# Patient Record
Sex: Male | Born: 1965
Health system: Southern US, Community
[De-identification: ages and names within clinical notes are randomized; demographics above are authoritative.]

## PROBLEM LIST (undated history)

## (undated) DIAGNOSIS — T7840XA Allergy, unspecified, initial encounter: Secondary | ICD-10-CM

## (undated) HISTORY — DX: Allergy, unspecified, initial encounter: T78.40XA

## (undated) HISTORY — PX: TONSILLECTOMY: SUR1361

## (undated) HISTORY — PX: KNEE SURGERY: SHX244

---

## 2007-07-31 ENCOUNTER — Encounter: Admission: RE | Admit: 2007-07-31 | Discharge: 2007-07-31 | Payer: Self-pay | Admitting: Internal Medicine

## 2010-10-20 ENCOUNTER — Telehealth: Payer: Self-pay | Admitting: Internal Medicine

## 2010-10-21 NOTE — Telephone Encounter (Signed)
I spoke with [facility] , patient is scheduled with Dr Leone Payor for tomorrow at 3:30.  They will notify the patient of the appointment date and time.

## 2010-10-21 NOTE — Telephone Encounter (Signed)
I have left a voicemail for the patient also about the appt date and time.

## 2010-10-22 ENCOUNTER — Ambulatory Visit (INDEPENDENT_AMBULATORY_CARE_PROVIDER_SITE_OTHER): Payer: BC Managed Care – PPO | Admitting: Internal Medicine

## 2010-10-22 ENCOUNTER — Encounter: Payer: Self-pay | Admitting: Internal Medicine

## 2010-10-22 VITALS — BP 124/60 | HR 88 | Ht 71.0 in | Wt 170.0 lb

## 2010-10-22 DIAGNOSIS — K921 Melena: Secondary | ICD-10-CM

## 2010-10-22 MED ORDER — PEG-KCL-NACL-NASULF-NA ASC-C 100 G PO SOLR: 1.0000 | Freq: Once | ORAL | Status: AC

## 2010-10-22 NOTE — Patient Instructions (Signed)
Colonoscopy has been scheduled for 10/26/10 2:30. Pm arrive at 1:30 pm on 4th floor. Instructions have been reviewed and copy given to you. Go to your pharmacy and pick up your prescription.

## 2010-10-22 NOTE — Progress Notes (Signed)
Subjective:    Patient ID: Cody Jennings, male    DOB: April 15, 1966, 45 y.o.   MRN: [id number]  HPI Comments: 45 year old white man with onset of passing blood about 3 months ago. Perhaps longer. He saw his primary care physician this resolved spontaneously. He went back and was evaluated and had a hemoglobin of 15.5 and hematocrit 44 with an MCV of 89 on 10/18/2010. He is describing painless passage of blood intermittently on at least a few occasions. He is an avid exerciser and was doing some exercises in the gym where he was inverted and flexing in his abdomen had some left lower quadrant and left flank pain at one point which has been helped by a chiropractor. He has not noted any bulging hemorrhoids and his primary care physician did not detect any on physical exam. In the interim he motion or test was negative for occult blood. He does not lift heavy weights. The blood was described as on the stool and in the commode but not necessarily on the toilet paper. Was in the stool as well. No melena described.  Rectal Bleeding  The current episode started more than 2 weeks ago. The onset was sudden. The problem occurs rarely. The patient is experiencing no pain. The stool is described as soft, bloody and mixed with blood. There was no prior successful therapy. There was no prior unsuccessful therapy. Associated symptoms include hematuria. Pertinent negatives include no anorexia, no fever, no abdominal pain, no diarrhea, no hemorrhoids, no nausea, no rectal pain and no vomiting.      Review of Systems  Constitutional: Negative for fever.  Gastrointestinal: Positive for hematochezia. Negative for nausea, vomiting, abdominal pain, diarrhea, rectal pain, anorexia and hemorrhoids.  Genitourinary: Positive for hematuria.   he had some microscopic hematuria and was prescribed Cipro for this empirically. All other abuse systems negative or as mentioned above in the history of present illness. History  reviewed. No pertinent past medical history. Past Surgical History  Procedure Date  . Knee surgery     Left  . Tonsillectomy     reports that he has never smoked. He has never used smokeless tobacco. He reports that he does not drink alcohol or use illicit drugs. family history includes Diabetes in his mother; Hyperlipidemia in an unspecified family member; Kidney cancer in his father; and Lung cancer in his father.  There is no history of Colon cancer. No Known Allergies Meds: Cipro 500 mg, Propecia, multivitamin, fish oil, vitamin D      Objective:   Physical Exam  Constitutional: He is oriented to person, place, and time. He appears well-developed and well-nourished.  HENT:  Head: Normocephalic.  Mouth/Throat: Oropharynx is clear and moist.  Eyes: Conjunctivae are normal. Pupils are equal, round, and reactive to light. No scleral icterus.  Neck: Normal range of motion. Neck supple. No thyromegaly present.  Cardiovascular: Normal rate, regular rhythm and normal heart sounds.   No murmur heard. Pulmonary/Chest: Effort normal and breath sounds normal.  Abdominal: Soft. Bowel sounds are normal. He exhibits no distension and no mass. There is no tenderness.  Genitourinary:       Rectal exam deferred until colonoscopy  Lymphadenopathy:    He has no cervical adenopathy.  Neurological: He is alert and oriented to person, place, and time.  Skin: Skin is warm and dry.  Psychiatric: He has a normal mood and affect.          Assessment & Plan:  Hematochezia, small volume  At  his age it is appropriate to investigate with a colonoscopy. We discussed flexible sigmoidoscopy versus colonoscopy. The odds are this is an anorectal source but the more serious lesion should be excluded. I've explained the risks benefits and indications and he understands and agrees to proceed.

## 2010-10-23 ENCOUNTER — Encounter: Payer: Self-pay | Admitting: Internal Medicine

## 2010-10-23 ENCOUNTER — Telehealth: Payer: Self-pay | Admitting: Internal Medicine

## 2010-10-26 ENCOUNTER — Other Ambulatory Visit: Payer: BC Managed Care – PPO | Admitting: Internal Medicine

## 2010-10-26 NOTE — Telephone Encounter (Signed)
Pt stated he cancelled his appt because his insurance states the procedure has to be done at an in house facility? When I checked with our Morrie Sheldon, she stated pt has a [employer] and his deductible is $5000. Informed the pt who stated he will check with [employer] and call back.

## 2010-10-28 NOTE — Telephone Encounter (Signed)
Pt has not called back.

## 2011-07-23 ENCOUNTER — Encounter (INDEPENDENT_AMBULATORY_CARE_PROVIDER_SITE_OTHER): Payer: BC Managed Care – PPO | Admitting: Family Medicine

## 2011-07-23 DIAGNOSIS — N529 Male erectile dysfunction, unspecified: Secondary | ICD-10-CM

## 2011-07-23 DIAGNOSIS — Z23 Encounter for immunization: Secondary | ICD-10-CM

## 2011-07-23 DIAGNOSIS — Z0000 Encounter for general adult medical examination without abnormal findings: Secondary | ICD-10-CM

## 2011-07-23 DIAGNOSIS — K921 Melena: Secondary | ICD-10-CM

## 2011-12-02 ENCOUNTER — Telehealth: Payer: Self-pay

## 2011-12-02 ENCOUNTER — Other Ambulatory Visit: Payer: Self-pay | Admitting: Family Medicine

## 2011-12-02 NOTE — Telephone Encounter (Signed)
Pt states he is on an ED rx (thinks viagra) and would like to change the dosage from 50mg  to 100mg  so he can 'cut them up'.  Please call pt to discuss and fill if possible.  Best: [phone] [facility]  Also states the pharmacy should be contacting us about another rx he is out of but can't remember the name of.

## 2011-12-03 ENCOUNTER — Other Ambulatory Visit: Payer: Self-pay | Admitting: Family Medicine

## 2011-12-03 MED ORDER — SILDENAFIL CITRATE 50 MG PO TABS: ORAL_TABLET | ORAL | Status: DC

## 2011-12-03 NOTE — Telephone Encounter (Signed)
Spoke with patient and let him know that we sent in refills for patients 2 medicines (Proscar and Viagra) and advised that he needed recheck for more.  Patient states that since the recheck was in regards to his cholesterol, he should be able to get his other meds refilled longer b/c they are not related to those labs.  Chart in MDs box to review and advise.

## 2011-12-27 ENCOUNTER — Other Ambulatory Visit: Payer: Self-pay | Admitting: Family Medicine

## 2011-12-27 MED ORDER — FINASTERIDE 5 MG PO TABS: ORAL_TABLET | ORAL | Status: DC

## 2011-12-27 MED ORDER — SILDENAFIL CITRATE 100 MG PO TABS: ORAL_TABLET | ORAL | Status: DC

## 2011-12-27 NOTE — Telephone Encounter (Signed)
I can refill the viagra and proscar through December, but will need to have ov with likely PSA testing at that time, as last checked in December 2012.

## 2011-12-27 NOTE — Telephone Encounter (Signed)
LMOM for pt that RFs were done through Dec but then OV/Labs due.

## 2012-04-21 ENCOUNTER — Encounter: Payer: BC Managed Care – PPO | Admitting: Family Medicine

## 2015-05-16 ENCOUNTER — Ambulatory Visit (INDEPENDENT_AMBULATORY_CARE_PROVIDER_SITE_OTHER): Payer: BLUE CROSS/BLUE SHIELD | Admitting: Family Medicine

## 2015-05-16 VITALS — BP 128/68 | HR 66 | Temp 97.9°F | Resp 16 | Ht 71.5 in | Wt 173.4 lb

## 2015-05-16 DIAGNOSIS — Z113 Encounter for screening for infections with a predominantly sexual mode of transmission: Secondary | ICD-10-CM

## 2015-05-16 DIAGNOSIS — Z114 Encounter for screening for human immunodeficiency virus [HIV]: Secondary | ICD-10-CM

## 2015-05-16 DIAGNOSIS — Z130 Encounter for screening for diseases of the blood and blood-forming organs and certain disorders involving the immune mechanism: Secondary | ICD-10-CM | POA: Diagnosis not present

## 2015-05-16 DIAGNOSIS — Z23 Encounter for immunization: Secondary | ICD-10-CM

## 2015-05-16 DIAGNOSIS — M25562 Pain in left knee: Secondary | ICD-10-CM

## 2015-05-16 DIAGNOSIS — Z0000 Encounter for general adult medical examination without abnormal findings: Secondary | ICD-10-CM

## 2015-05-16 DIAGNOSIS — Z131 Encounter for screening for diabetes mellitus: Secondary | ICD-10-CM

## 2015-05-16 DIAGNOSIS — Z13220 Encounter for screening for lipoid disorders: Secondary | ICD-10-CM | POA: Diagnosis not present

## 2015-05-16 LAB — COMPLETE METABOLIC PANEL WITH GFR
ALK PHOS: 54 U/L (ref 40–115)
ALT: 32 U/L (ref 9–46)
AST: 20 U/L (ref 10–40)
Albumin: 4.2 g/dL (ref 3.6–5.1)
BUN: 25 mg/dL (ref 7–25)
CO2: 25 mmol/L (ref 20–31)
Calcium: 9.3 mg/dL (ref 8.6–10.3)
Chloride: 102 mmol/L (ref 98–110)
Creat: 1.2 mg/dL (ref 0.60–1.35)
GFR, EST AFRICAN AMERICAN: 82 mL/min (ref >=60)
GFR, Est Non African American: 71 mL/min (ref >=60)
Glucose, Bld: 105 mg/dL — ABNORMAL HIGH (ref 65–99)
Potassium: 4.1 mmol/L (ref 3.5–5.3)
Sodium: 138 mmol/L (ref 135–146)
TOTAL PROTEIN: 6.7 g/dL (ref 6.1–8.1)
Total Bilirubin: 0.5 mg/dL (ref 0.2–1.2)

## 2015-05-16 LAB — POCT CBC
Granulocyte percent: 53.5 %G (ref 37–80)
HCT, POC: 42.6 % — AB (ref 43.5–53.7)
HEMOGLOBIN: 14.9 g/dL (ref 14.1–18.1)
Lymph, poc: 2.1 (ref 0.6–3.4)
MCH: 30.7 pg (ref 27–31.2)
MCHC: 35 g/dL (ref 31.8–35.4)
MCV: 87.8 fL (ref 80–97)
MID (CBC): 0.2 (ref 0–0.9)
MPV: 9.4 fL (ref 0–99.8)
PLATELET COUNT, POC: 188 10*3/uL (ref 142–424)
POC Granulocyte: 2.7 (ref 2–6.9)
POC LYMPH PERCENT: 42.1 %L (ref 10–50)
POC MID %: 4.4 %M (ref 0–12)
RBC: 4.86 M/uL (ref 4.69–6.13)
RDW, POC: 12.6 %
WBC: 5 10*3/uL (ref 4.6–10.2)

## 2015-05-16 LAB — LIPID PANEL
CHOL/HDL RATIO: 4.5 ratio (ref <=5.0)
CHOLESTEROL: 257 mg/dL — AB (ref 125–200)
HDL: 57 mg/dL (ref >=40)
LDL Cholesterol: 143 mg/dL — ABNORMAL HIGH (ref <130)
TRIGLYCERIDES: 283 mg/dL — AB (ref <150)
VLDL: 57 mg/dL — AB (ref <30)

## 2015-05-16 NOTE — Progress Notes (Addendum)
Subjective:    Patient ID: Cody Jennings, male    DOB: 09-Feb-1966, 49 y.o.   MRN: [id number] This chart was scribed for Meredith Staggers, MD by Littie Deeds, Medical Scribe. This patient was seen in Room 3 and the patient's care was started at 4:25 PM.    HPI HPI Comments: Cody Jennings is a 49 y.o. male who presents to the [facility] for a complete physical exam. Patient needs a CBC for a blepharoplasty that he will have next month in [place]. He is not fasting today; he ate about 2 hours ago.  Cancer screening: His father was a smoker has lung cancer. Prostate cancer screening - he has not had PSA testing.  Immunizations: He will have the flu vaccine today. He is unsure when his last tetanus shot was.  Depression screening:  Depression screen [facility] 2/9 05/16/2015  Decreased Interest 0  Down, Depressed, Hopeless 0  PHQ - 2 Score 0   Exercise: Patient notes he cannot run due to left knee pain.  Vision: Patient saw Dr. Hyacinth Meeker, OD about 1.5 years ago and obtained reading glasses.  Visual Acuity Screening   Right eye Left eye Both eyes  Without correction: 20/20 20/20 20/20   With correction:       Dentist: He does have a dentist. He uses dip tobacco, which his dentist is aware of.   Hair loss: He has used finasteride in the past for hair loss at 0.25 tablet of the 5 mg tablet and has also used the 1 mg finasteride. He is still on the finasteride which is written by Dr. Jacqualyn Posey in [facility].  Left knee pain: Patient has had some knee pain that started about a day after he had done hot yoga. He feels as if the pain is in the inside of his knee. He notes that he is unable to run. He denies weakness and swelling. Patient had a scoped excision of plica procedure for his left knee over 10 years ago.  STI testing: Patient has had new sexual partners (male), but he uses a condom with sexual intercourse each time. He denies history of STIs.   Patient moved to [place] 6-7  years ago and has been in the area for a few weeks; he will return to [place] tomorrow. He still has property here and has been trying to sell it. 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   There are no active problems to display for this patient.  Past Medical History  Diagnosis Date  . Allergy    Past Surgical History  Procedure Laterality Date  . Knee surgery      Left  . Tonsillectomy     No Known Allergies Prior to Admission medications   Medication Sig Start Date End Date Taking? Authorizing Provider  ciprofloxacin (CIPRO) 500 MG tablet Take 500 mg by mouth 2 (two) times daily.      Historical Provider, MD  finasteride (PROPECIA) 1 MG tablet Take 1 mg by mouth daily.      Historical Provider, MD  finasteride (PROSCAR) 5 MG tablet Take 1/4 tablet daily 12/27/11   Shade Flood, MD  Multiple Vitamin (MULTIVITAMIN) tablet Take 1 tablet by mouth daily.      Historical Provider, MD  Omega-3 Fatty Acids (FISH OIL CONCENTRATE PO) Take by mouth. Take as directed daily     Historical Provider, MD  sildenafil (VIAGRA) 100 MG tablet 1/2 to 1 pill as directed 12/27/11  Shade Flood, MD  VITAMIN D, CHOLECALCIFEROL, PO Take 1 tablet by mouth daily.      Historical Provider, MD   Social History   Social History  . Marital Status: Single    Spouse Name: N/A  . Number of Children: 0  . Years of Education: N/A   Occupational History  . Sales    Social History Main Topics  . Smoking status: Never Smoker   . Smokeless tobacco: Never Used  . Alcohol Use: No  . Drug Use: No  . Sexual Activity: Not on file   Other Topics Concern  . 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     Review of Systems  Musculoskeletal: Positive for arthralgias.       Objective:   Physical Exam  Constitutional: He is oriented to  person, place, and time. He appears well-developed and well-nourished. No distress.  HENT:  Head: Normocephalic and atraumatic.  Mouth/Throat: Oropharynx is clear and moist. No oropharyngeal exudate.  Eyes: Pupils are equal, round, and reactive to light.  Neck: Neck supple.  Cardiovascular: Normal rate.   Pulmonary/Chest: Effort normal.  Musculoskeletal: He exhibits no edema.  Left knee: Full ROM. Skin intact, no erythema. No effusion. No bony tenderness. Patella appears to track normally with only minimal J sign. Negative varus, negative valgus. Minimal discomfort with external rotation on McMurray testing. Negative Lachman.  Neurological: He is alert and oriented to person, place, and time. No cranial nerve deficit.  Skin: Skin is warm and dry. No rash noted.  Psychiatric: He has a normal mood and affect. His behavior is normal.  Nursing note and vitals reviewed.   Filed Vitals:   05/16/15 1552  BP: 128/68  Pulse: 66  Temp: 97.9 F (36.6 C)  TempSrc: Oral  Resp: 16  Height: 5' 11.5" (1.816 m)  Weight: 173 lb 6.4 oz (78.654 kg)  SpO2: 98%       Assessment & Plan:  Cody Jennings is a 49 y.o. male Annual physical exam  --anticipatory guidance as below in AVS, screening labs above. Health maintenance items as above in HPI discussed/recommended as applicable.   Screening for diabetes mellitus - Plan: COMPLETE METABOLIC PANEL WITH GFR  Screening for hyperlipidemia - Plan: Lipid panel  Screening, anemia, deficiency, iron - Plan: POCT CBC  Routine screening for STI (sexually transmitted infection) - Plan: GC/Chlamydia Probe Amp, HIV antibody, RPR  -safer sex practices.   Screening for HIV (human immunodeficiency virus) - Plan: HIV antibody  Left knee pain  - prior plica removal, likley degenerative change over the years. Reassuring exam. Trial of episodic NSAID, HEP and return for XR or refer to ortho if persists.   Plan on repeat CPE at 50yo in 1 year. Will discuss other  screening/cancer screening at that time.   No orders of the defined types were placed in this encounter.   Patient Instructions  advil or alleve if needed for knee pain.  Keep quads strengthened as these protect the knee. Try some of the exercises below. If worsening pain or not improved - return here or provider in [place] for XR.   You should receive a call or letter about your lab results within the next week to 10 days.  If cholesterol elevated - may need repeated 8 hours fasting.   Keeping you healthy  Get these tests  Blood pressure- Have your blood pressure checked once a year by your healthcare provider.  Normal blood pressure is 120/80.  Weight- Have your body mass index (BMI) calculated to screen for obesity.  BMI is a measure of body fat based on height and weight. You can also calculate your own BMI at https://www.west-esparza.com/.  Cholesterol- Have your cholesterol checked regularly starting at age 19, sooner may be necessary if you have diabetes, high blood pressure, if a family member developed heart diseases at an early age or if you smoke.   Chlamydia, HIV, and other sexual transmitted disease- Get screened each year until the age of 8 then within three months of each new sexual partner.  Diabetes- Have your blood sugar checked regularly if you have high blood pressure, high cholesterol, a family history of diabetes or if you are overweight.  Get these vaccines  Flu shot- Every fall.  Tetanus shot- Every 10 years.  Menactra- Single dose; prevents meningitis.  Take these steps  Don't smoke- If you do smoke, ask your healthcare provider about quitting. For tips on how to quit, go to www.smokefree.gov or call 1-800-QUIT-NOW.  Be physically active- Exercise 5 days a week for at least 30 minutes.  If you are not already physically active start slow and gradually work up to 30 minutes of moderate physical activity.  Examples of moderate activity include walking briskly,  mowing the yard, dancing, swimming bicycling, etc.  Eat a healthy diet- Eat a variety of healthy foods such as fruits, vegetables, low fat milk, low fat cheese, yogurt, lean meats, poultry, fish, beans, tofu, etc.  For more information on healthy eating, go to www.thenutritionsource.org  Drink alcohol in moderation- Limit alcohol intake two drinks or less a day.  Never drink and drive.  Dentist- Brush and floss teeth twice daily; visit your dentis twice a year.  Depression-Your emotional health is as important as your physical health.  If you're feeling down, losing interest in things you normally enjoy please talk with your healthcare provider.  Gun Safety- If you keep a gun in your home, keep it unloaded and with the safety lock on.  Bullets should be stored separately.  Helmet use- Always wear a helmet when riding a motorcycle, bicycle, rollerblading or skateboarding.  Safe sex- If you may be exposed to a sexually transmitted infection, use a condom  Seat belts- Seat bels can save your life; always wear one.  Smoke/Carbon Monoxide detectors- These detectors need to be installed on the appropriate level of your home.  Replace batteries at least once a year.  Skin Cancer- When out in the sun, cover up and use sunscreen SPF 15 or higher.  Violence- If anyone is threatening or hurting you, please tell your healthcare provider.  Generic Knee Exercises EXERCISES RANGE OF MOTION (ROM) AND STRETCHING EXERCISES These exercises may help you when beginning to rehabilitate your injury. Your symptoms may resolve with or without further involvement from your physician, physical therapist, or athletic trainer. While completing these exercises, remember:  6. Restoring tissue flexibility helps normal motion to return to the joints. This allows healthier, less painful movement and activity. 7. An effective stretch should be held for at least 30 seconds. 8. A stretch should never be painful. You should  only feel a gentle lengthening or release in the stretched tissue. STRETCH - Knee Extension, Prone 4. Lie on your stomach on a firm surface, such as a bed or countertop. Place your right / left  knee and leg just beyond the edge of the surface. You may wish to place a towel under the far end of your right / left thigh for comfort. 5. Relax your leg muscles and allow gravity to straighten your knee. Your clinician may advise you to add an ankle weight if more resistance is helpful for you. 6. You should feel a stretch in the back of your right / left knee. Hold this position for __________ seconds. Repeat __________ times. Complete this stretch __________ times per day. * Your physician, physical therapist, or athletic trainer may ask you to add ankle weight to enhance your stretch.  RANGE OF MOTION - Knee Flexion, Active 14. Lie on your back with both knees straight. (If this causes back discomfort, bend your opposite knee, placing your foot flat on the floor.) 15. Slowly slide your heel back toward your buttocks until you feel a gentle stretch in the front of your knee or thigh. 16. Hold for __________ seconds. Slowly slide your heel back to the starting position. Repeat __________ times. Complete this exercise __________ times per day.  STRETCH - Quadriceps, Prone   Lie on your stomach on a firm surface, such as a bed or padded floor.  Bend your right / left knee and grasp your ankle. If you are unable to reach your ankle or pant leg, use a belt around your foot to lengthen your reach.  Gently pull your heel toward your buttocks. Your knee should not slide out to the side. You should feel a stretch in the front of your thigh and/or knee.  Hold this position for __________ seconds. Repeat __________ times. Complete this stretch __________ times per day.  STRETCH - Hamstrings, Supine   Lie on your back. Loop a belt or towel over the ball of your right / left foot.  Straighten your right /  left knee and slowly pull on the belt to raise your leg. Do not allow the right / left knee to bend. Keep your opposite leg flat on the floor.  Raise the leg until you feel a gentle stretch behind your right / left knee or thigh. Hold this position for __________ seconds. Repeat __________ times. Complete this stretch __________ times per day.  STRENGTHENING EXERCISES These exercises may help you when beginning to rehabilitate your injury. They may resolve your symptoms with or without further involvement from your physician, physical therapist, or athletic trainer. While completing these exercises, remember:   Muscles can gain both the endurance and the strength needed for everyday activities through controlled exercises.  Complete these exercises as instructed by your physician, physical therapist, or athletic trainer. Progress the resistance and repetitions only as guided.  You may experience muscle soreness or fatigue, but the pain or discomfort you are trying to eliminate should never worsen during these exercises. If this pain does worsen, stop and make certain you are following the directions exactly. If the pain is still present after adjustments, discontinue the exercise until you can discuss the trouble with your clinician. STRENGTH - Quadriceps, Isometrics  Lie on your back with your right / left leg extended and your opposite knee bent.  Gradually tense the muscles in the front of your right / left thigh. You should see either your knee cap slide up toward your hip or increased dimpling just above the knee. This motion will push the back of the knee down toward the floor/mat/bed on which you are lying.  Hold the muscle as tight as you  can without increasing your pain for __________ seconds.  Relax the muscles slowly and completely in between each repetition. Repeat __________ times. Complete this exercise __________ times per day.  STRENGTH - Quadriceps, Short Arcs   Lie on your  back. Place a __________ inch towel roll under your knee so that the knee slightly bends.  Raise only your lower leg by tightening the muscles in the front of your thigh. Do not allow your thigh to rise.  Hold this position for __________ seconds. Repeat __________ times. Complete this exercise __________ times per day.  OPTIONAL ANKLE WEIGHTS: Begin with ____________________, but DO NOT exceed ____________________. Increase in 1 pound/0.5 kilogram increments.  STRENGTH - Quadriceps, Straight Leg Raises  Quality counts! Watch for signs that the quadriceps muscle is working to insure you are strengthening the correct muscles and not "cheating" by substituting with healthier muscles.  Lay on your back with your right / left leg extended and your opposite knee bent.  Tense the muscles in the front of your right / left thigh. You should see either your knee cap slide up or increased dimpling just above the knee. Your thigh may even quiver.  Tighten these muscles even more and raise your leg 4 to 6 inches off the floor. Hold for __________ seconds.  Keeping these muscles tense, lower your leg.  Relax the muscles slowly and completely in between each repetition. Repeat __________ times. Complete this exercise __________ times per day.  STRENGTH - Hamstring, Curls  Lay on your stomach with your legs extended. (If you lay on a bed, your feet may hang over the edge.)  Tighten the muscles in the back of your thigh to bend your right / left knee up to 90 degrees. Keep your hips flat on the bed/floor.  Hold this position for __________ seconds.  Slowly lower your leg back to the starting position. Repeat __________ times. Complete this exercise __________ times per day.  OPTIONAL ANKLE WEIGHTS: Begin with ____________________, but DO NOT exceed ____________________. Increase in 1 pound/0.5 kilogram increments.  STRENGTH - Quadriceps, Squats  Stand in a door frame so that your feet and knees are  in line with the frame.  Use your hands for balance, not support, on the frame.  Slowly lower your weight, bending at the hips and knees. Keep your lower legs upright so that they are parallel with the door frame. Squat only within the range that does not increase your knee pain. Never let your hips drop below your knees.  Slowly return upright, pushing with your legs, not pulling with your hands. Repeat __________ times. Complete this exercise __________ times per day.  STRENGTH - Quadriceps, Wall Slides  Follow guidelines for form closely. Increased knee pain often results from poorly placed feet or knees.  Lean against a smooth wall or door and walk your feet out 18-24 inches. Place your feet hip-width apart.  Slowly slide down the wall or door until your knees bend __________ degrees.* Keep your knees over your heels, not your toes, and in line with your hips, not falling to either side.  Hold for __________ seconds. Stand up to rest for __________ seconds in between each repetition. Repeat __________ times. Complete this exercise __________ times per day. * Your physician, physical therapist, or athletic trainer will alter this angle based on your symptoms and progress.   This information is not intended to replace advice given to you by your health care provider. Make sure you discuss any questions you  have with your health care provider.   Document Released: 05/26/2005 Document Revised: 08/02/2014 Document Reviewed: 10/24/2008 Elsevier Interactive Patient Education [employer]     I personally performed the services described in this documentation, which was scribed in my presence. The recorded information has been reviewed and considered, and addended by me as needed.    By signing my name below, I, Littie Deeds, attest that this documentation has been prepared under the direction and in the presence of Meredith Staggers, MD.  Electronically Signed: Littie Deeds, Medical  Scribe. 05/16/2015. 4:25 PM.

## 2015-05-16 NOTE — Patient Instructions (Signed)
advil or alleve if needed for knee pain.  Keep quads strengthened as these protect the knee. Try some of the exercises below. If worsening pain or not improved - return here or provider in [place] for XR.   You should receive a call or letter about your lab results within the next week to 10 days.  If cholesterol elevated - may need repeated 8 hours fasting.   Keeping you healthy  Get these tests  Blood pressure- Have your blood pressure checked once a year by your healthcare provider.  Normal blood pressure is 120/80.  Weight- Have your body mass index (BMI) calculated to screen for obesity.  BMI is a measure of body fat based on height and weight. You can also calculate your own BMI at https://www.west-esparza.com/.  Cholesterol- Have your cholesterol checked regularly starting at age 59, sooner may be necessary if you have diabetes, high blood pressure, if a family member developed heart diseases at an early age or if you smoke.   Chlamydia, HIV, and other sexual transmitted disease- Get screened each year until the age of 22 then within three months of each new sexual partner.  Diabetes- Have your blood sugar checked regularly if you have high blood pressure, high cholesterol, a family history of diabetes or if you are overweight.  Get these vaccines  Flu shot- Every fall.  Tetanus shot- Every 10 years.  Menactra- Single dose; prevents meningitis.  Take these steps  Don't smoke- If you do smoke, ask your healthcare provider about quitting. For tips on how to quit, go to www.smokefree.gov or call 1-800-QUIT-NOW.  Be physically active- Exercise 5 days a week for at least 30 minutes.  If you are not already physically active start slow and gradually work up to 30 minutes of moderate physical activity.  Examples of moderate activity include walking briskly, mowing the yard, dancing, swimming bicycling, etc.  Eat a healthy diet- Eat a variety of healthy foods such as fruits, vegetables, low  fat milk, low fat cheese, yogurt, lean meats, poultry, fish, beans, tofu, etc.  For more information on healthy eating, go to www.thenutritionsource.org  Drink alcohol in moderation- Limit alcohol intake two drinks or less a day.  Never drink and drive.  Dentist- Brush and floss teeth twice daily; visit your dentis twice a year.  Depression-Your emotional health is as important as your physical health.  If you're feeling down, losing interest in things you normally enjoy please talk with your healthcare provider.  Gun Safety- If you keep a gun in your home, keep it unloaded and with the safety lock on.  Bullets should be stored separately.  Helmet use- Always wear a helmet when riding a motorcycle, bicycle, rollerblading or skateboarding.  Safe sex- If you may be exposed to a sexually transmitted infection, use a condom  Seat belts- Seat bels can save your life; always wear one.  Smoke/Carbon Monoxide detectors- These detectors need to be installed on the appropriate level of your home.  Replace batteries at least once a year.  Skin Cancer- When out in the sun, cover up and use sunscreen SPF 15 or higher.  Violence- If anyone is threatening or hurting you, please tell your healthcare provider.  Generic Knee Exercises EXERCISES RANGE OF MOTION (ROM) AND STRETCHING EXERCISES These exercises may help you when beginning to rehabilitate your injury. Your symptoms may resolve with or without further involvement from your physician, physical therapist, or athletic trainer. While completing these exercises, remember:  6. Restoring tissue  flexibility helps normal motion to return to the joints. This allows healthier, less painful movement and activity. 7. An effective stretch should be held for at least 30 seconds. 8. A stretch should never be painful. You should only feel a gentle lengthening or release in the stretched tissue. STRETCH - Knee Extension, Prone 4. Lie on your stomach on a firm  surface, such as a bed or countertop. Place your right / left knee and leg just beyond the edge of the surface. You may wish to place a towel under the far end of your right / left thigh for comfort. 5. Relax your leg muscles and allow gravity to straighten your knee. Your clinician may advise you to add an ankle weight if more resistance is helpful for you. 6. You should feel a stretch in the back of your right / left knee. Hold this position for __________ seconds. Repeat __________ times. Complete this stretch __________ times per day. * Your physician, physical therapist, or athletic trainer may ask you to add ankle weight to enhance your stretch.  RANGE OF MOTION - Knee Flexion, Active 14. Lie on your back with both knees straight. (If this causes back discomfort, bend your opposite knee, placing your foot flat on the floor.) 15. Slowly slide your heel back toward your buttocks until you feel a gentle stretch in the front of your knee or thigh. 16. Hold for __________ seconds. Slowly slide your heel back to the starting position. Repeat __________ times. Complete this exercise __________ times per day.  STRETCH - Quadriceps, Prone   Lie on your stomach on a firm surface, such as a bed or padded floor.  Bend your right / left knee and grasp your ankle. If you are unable to reach your ankle or pant leg, use a belt around your foot to lengthen your reach.  Gently pull your heel toward your buttocks. Your knee should not slide out to the side. You should feel a stretch in the front of your thigh and/or knee.  Hold this position for __________ seconds. Repeat __________ times. Complete this stretch __________ times per day.  STRETCH - Hamstrings, Supine   Lie on your back. Loop a belt or towel over the ball of your right / left foot.  Straighten your right / left knee and slowly pull on the belt to raise your leg. Do not allow the right / left knee to bend. Keep your opposite leg flat on the  floor.  Raise the leg until you feel a gentle stretch behind your right / left knee or thigh. Hold this position for __________ seconds. Repeat __________ times. Complete this stretch __________ times per day.  STRENGTHENING EXERCISES These exercises may help you when beginning to rehabilitate your injury. They may resolve your symptoms with or without further involvement from your physician, physical therapist, or athletic trainer. While completing these exercises, remember:   Muscles can gain both the endurance and the strength needed for everyday activities through controlled exercises.  Complete these exercises as instructed by your physician, physical therapist, or athletic trainer. Progress the resistance and repetitions only as guided.  You may experience muscle soreness or fatigue, but the pain or discomfort you are trying to eliminate should never worsen during these exercises. If this pain does worsen, stop and make certain you are following the directions exactly. If the pain is still present after adjustments, discontinue the exercise until you can discuss the trouble with your clinician. STRENGTH - Quadriceps, Isometrics  Lie on your back with your right / left leg extended and your opposite knee bent.  Gradually tense the muscles in the front of your right / left thigh. You should see either your knee cap slide up toward your hip or increased dimpling just above the knee. This motion will push the back of the knee down toward the floor/mat/bed on which you are lying.  Hold the muscle as tight as you can without increasing your pain for __________ seconds.  Relax the muscles slowly and completely in between each repetition. Repeat __________ times. Complete this exercise __________ times per day.  STRENGTH - Quadriceps, Short Arcs   Lie on your back. Place a __________ inch towel roll under your knee so that the knee slightly bends.  Raise only your lower leg by tightening the  muscles in the front of your thigh. Do not allow your thigh to rise.  Hold this position for __________ seconds. Repeat __________ times. Complete this exercise __________ times per day.  OPTIONAL ANKLE WEIGHTS: Begin with ____________________, but DO NOT exceed ____________________. Increase in 1 pound/0.5 kilogram increments.  STRENGTH - Quadriceps, Straight Leg Raises  Quality counts! Watch for signs that the quadriceps muscle is working to insure you are strengthening the correct muscles and not "cheating" by substituting with healthier muscles.  Lay on your back with your right / left leg extended and your opposite knee bent.  Tense the muscles in the front of your right / left thigh. You should see either your knee cap slide up or increased dimpling just above the knee. Your thigh may even quiver.  Tighten these muscles even more and raise your leg 4 to 6 inches off the floor. Hold for __________ seconds.  Keeping these muscles tense, lower your leg.  Relax the muscles slowly and completely in between each repetition. Repeat __________ times. Complete this exercise __________ times per day.  STRENGTH - Hamstring, Curls  Lay on your stomach with your legs extended. (If you lay on a bed, your feet may hang over the edge.)  Tighten the muscles in the back of your thigh to bend your right / left knee up to 90 degrees. Keep your hips flat on the bed/floor.  Hold this position for __________ seconds.  Slowly lower your leg back to the starting position. Repeat __________ times. Complete this exercise __________ times per day.  OPTIONAL ANKLE WEIGHTS: Begin with ____________________, but DO NOT exceed ____________________. Increase in 1 pound/0.5 kilogram increments.  STRENGTH - Quadriceps, Squats  Stand in a door frame so that your feet and knees are in line with the frame.  Use your hands for balance, not support, on the frame.  Slowly lower your weight, bending at the hips and  knees. Keep your lower legs upright so that they are parallel with the door frame. Squat only within the range that does not increase your knee pain. Never let your hips drop below your knees.  Slowly return upright, pushing with your legs, not pulling with your hands. Repeat __________ times. Complete this exercise __________ times per day.  STRENGTH - Quadriceps, Wall Slides  Follow guidelines for form closely. Increased knee pain often results from poorly placed feet or knees.  Lean against a smooth wall or door and walk your feet out 18-24 inches. Place your feet hip-width apart.  Slowly slide down the wall or door until your knees bend __________ degrees.* Keep your knees over your heels, not your toes, and in line with your  hips, not falling to either side.  Hold for __________ seconds. Stand up to rest for __________ seconds in between each repetition. Repeat __________ times. Complete this exercise __________ times per day. * Your physician, physical therapist, or athletic trainer will alter this angle based on your symptoms and progress.   This information is not intended to replace advice given to you by your health care provider. Make sure you discuss any questions you have with your health care provider.   Document Released: 05/26/2005 Document Revised: 08/02/2014 Document Reviewed: 10/24/2008 Elsevier Interactive Patient Education [employer]

## 2015-05-17 LAB — GC/CHLAMYDIA PROBE AMP
CT Probe RNA: NEGATIVE
GC Probe RNA: NEGATIVE

## 2015-05-17 LAB — HIV ANTIBODY (ROUTINE TESTING W REFLEX): HIV 1&2 Ab, 4th Generation: NONREACTIVE

## 2015-05-17 LAB — RPR

## 2015-05-28 ENCOUNTER — Encounter: Payer: Self-pay | Admitting: *Deleted

## 2015-06-28 ENCOUNTER — Ambulatory Visit (INDEPENDENT_AMBULATORY_CARE_PROVIDER_SITE_OTHER): Payer: BLUE CROSS/BLUE SHIELD | Admitting: Physician Assistant

## 2015-06-28 VITALS — BP 102/60 | HR 58 | Temp 97.5°F | Resp 16 | Ht 71.75 in | Wt 174.4 lb

## 2015-06-28 DIAGNOSIS — N529 Male erectile dysfunction, unspecified: Secondary | ICD-10-CM

## 2015-06-28 DIAGNOSIS — R109 Unspecified abdominal pain: Secondary | ICD-10-CM

## 2015-06-28 MED ORDER — TADALAFIL 10 MG PO TABS: 10.0000 mg | ORAL_TABLET | Freq: Every day | ORAL | Status: DC | PRN

## 2015-06-28 NOTE — Progress Notes (Signed)
Cody Jennings  MRN: [id number] DOB: [birth year]  Subjective:  Pt presents to clinic with mid abd pain more like a discomfort for the last 6 months - seems to be most related to extension of the abd wall muscles like when he is doing dead lifts.  He does not feel like it is related to anything else and he has not found anything that makes it worse.  He states the pain is dull and has been in the same location since it started and it has not gotten more frequent.  It happens about 3 times a week and lasts less than and hour when it occurs and then spontaneously resolves.  He does not have to stop/change his activities when it occurs and the pain has not changed anything that he does as a result of this pain.  He has had no change in his bowel habits.  He has rare blood on toilet tissue - about 2-3 times this year.  At the end of the visit he asks for a Rx for Cialis - he has been on it before for inability to maintain an erection during sexual events.  He still gets morning erections but they are softer than they used to be.  He has noted that over the last several years he has noticed a decrease in his energy - he believes he has had a PSA done in the past but he does not believe he has ever had a testosterone test done.  There are no active problems to display for this patient.   Current Outpatient Prescriptions on File Prior to Visit  Medication Sig Dispense Refill  . finasteride (PROPECIA) 1 MG tablet Take 1 mg by mouth daily.       No current facility-administered medications on file prior to visit.    No Known Allergies  Review of Systems  Gastrointestinal: Positive for abdominal pain. Negative for diarrhea, constipation and blood in stool.   Objective:  BP 102/60 mmHg  Pulse 58  Temp(Src) 97.5 F (36.4 C) (Oral)  Resp 16  Ht 5' 11.75" (1.822 m)  Wt 174 lb 6.4 oz (79.107 kg)  BMI 23.83 kg/m2  SpO2 97%  Physical Exam  Constitutional: He is oriented to person, place, and time  and well-developed, well-nourished, and in no distress.  HENT:  Head: Normocephalic and atraumatic.  Right Ear: External ear normal.  Left Ear: External ear normal.  Eyes: Conjunctivae are normal.  Neck: Normal range of motion.  Cardiovascular: Normal rate, regular rhythm and normal heart sounds.   No murmur heard. Pulmonary/Chest: Effort normal and breath sounds normal. He has no wheezes.  Abdominal: Soft. Normal appearance and bowel sounds are normal. He exhibits no distension and no mass. There is no hepatosplenomegaly. There is no tenderness. There is no rebound and no guarding.    Neurological: He is alert and oriented to person, place, and time. Gait normal.  Skin: Skin is warm and dry.  Psychiatric: Mood, memory, affect and judgment normal.    Assessment and Plan :  Abdominal wall pain - Plan: Ambulatory referral to General Surgery  Impotence - Plan: Testosterone, Free, Total, SHBG, tadalafil (CIALIS) 10 MG tablet   I am unable to feel a hernia but that is what the patient is describing - he would like to have a surgeon evaluate this discomfort.  A referral was done but he would like to make his own appointment.  I gave him a Rx for cialis since he has had in  the past with good successful - due to his age and no co-morbidities we will check a testosterone level today.  Cody Lennert PA-C  [facility] 06/28/2015 5:17 PM

## 2015-06-28 NOTE — Patient Instructions (Signed)
[facility] [address] [phone]

## 2015-06-30 LAB — TESTOSTERONE, FREE, TOTAL, SHBG
Sex Hormone Binding: 41 nmol/L (ref 10–50)
TESTOSTERONE FREE: 50 pg/mL (ref 47.0–244.0)
TESTOSTERONE-% FREE: 1.7 % (ref 1.6–2.9)
Testosterone: 292 ng/dL — ABNORMAL LOW (ref 300–890)

## 2015-07-09 ENCOUNTER — Telehealth: Payer: Self-pay

## 2015-07-09 DIAGNOSIS — R7989 Other specified abnormal findings of blood chemistry: Secondary | ICD-10-CM

## 2015-07-09 NOTE — Telephone Encounter (Signed)
Pt would like referral for low testosterone

## 2015-08-07 ENCOUNTER — Encounter: Payer: Self-pay | Admitting: Endocrinology

## 2015-08-25 ENCOUNTER — Telehealth: Payer: Self-pay

## 2015-08-25 DIAGNOSIS — R109 Unspecified abdominal pain: Secondary | ICD-10-CM

## 2015-08-25 DIAGNOSIS — M25562 Pain in left knee: Secondary | ICD-10-CM

## 2015-08-25 NOTE — Telephone Encounter (Signed)
Possible hernia - second call - last week only in town a couple of weeks  Left knee - ortho - Dr. Neva Seat   Hernia - Benny Lennert   Requesting two referrals  [id number]

## 2015-08-25 NOTE — Telephone Encounter (Signed)
Referrals made from last ov notes.  Advised pt.

## 2015-08-26 ENCOUNTER — Telehealth: Payer: Self-pay

## 2015-08-26 DIAGNOSIS — N529 Male erectile dysfunction, unspecified: Secondary | ICD-10-CM

## 2015-08-26 NOTE — Telephone Encounter (Signed)
Cody Jennings, pharm reqs a new Rx for cialis, but they have req'd strength/dose different from what you Rxd. I pended what you gave the pt. The pharm reqs tadalafil 20 mg, 1 po prn max 1 tab Q36 hrs. Please advise. The pharm is not coming up in [facility]: [name], [place], [place], Ph # [phone], fax # [phone]. Rf will need to be faxed in.

## 2015-08-27 MED ORDER — TADALAFIL 20 MG PO TABS
10.0000 mg | ORAL_TABLET | ORAL | Status: DC | PRN
Start: 2015-08-27 — End: 2015-12-11

## 2015-08-27 NOTE — Telephone Encounter (Signed)
Faxed

## 2015-08-27 NOTE — Telephone Encounter (Signed)
Done

## 2015-09-02 ENCOUNTER — Telehealth: Payer: Self-pay

## 2015-09-02 DIAGNOSIS — R109 Unspecified abdominal pain: Secondary | ICD-10-CM

## 2015-09-02 NOTE — Telephone Encounter (Signed)
The patient called to ask for an imaging order to evaluate if he has a hernia.  He said he thinks there was a misunderstanding about what he wants.  I asked him if he still wanted the referral for general surgery, but he said he just wants the "right" referral (the imaging scan).  Please advise, thank you.  CB#: [phone]

## 2015-09-02 NOTE — Telephone Encounter (Signed)
The patient left a message about his referral to general surgery for abdominal pain.  He said he's called 4-5 times regarding this referral.  The referral was sent to [facility] on 08/26/15 and we received a call stating that they do not see for abdominal pain.  A message was sent to Benny Lennert on 08/28/15 requesting for the referral diagnosis to be changed.  Please advise, thank you.  Pt CB#: [phone]

## 2015-09-02 NOTE — Telephone Encounter (Signed)
The referral is because the patient thinks he has an abd wall hernia.  He wanted to see a Careers adviser when I saw him.  If he wants an [place] at this point please let me know.  I am happy to do either.

## 2015-09-02 NOTE — Telephone Encounter (Signed)
Sarah  Please see previous message 

## 2015-09-02 NOTE — Telephone Encounter (Signed)
Advised patient is a different phone message.

## 2015-09-03 NOTE — Telephone Encounter (Signed)
Spoke to pt. He states he did not want referral to surgeon. He is fine with [place]. Pt leaves next Saturday to go back home to [place] so he needs to have US done ASAP. He does not reside in [place] normally.

## 2015-09-04 NOTE — Telephone Encounter (Signed)
I have placed the order for the patient and he should be hearing soon about the appt.

## 2015-09-09 NOTE — Telephone Encounter (Signed)
Spoke with pt. He has been scheduled for u/s.

## 2015-09-10 ENCOUNTER — Ambulatory Visit: Payer: Self-pay | Admitting: Endocrinology

## 2015-09-11 ENCOUNTER — Inpatient Hospital Stay: Admission: RE | Admit: 2015-09-11 | Payer: Self-pay | Source: Ambulatory Visit

## 2015-09-11 ENCOUNTER — Ambulatory Visit
Admission: RE | Admit: 2015-09-11 | Discharge: 2015-09-11 | Disposition: A | Discharge status: Home / Self Care | Payer: BLUE CROSS/BLUE SHIELD | Source: Ambulatory Visit | Attending: Physician Assistant | Admitting: Physician Assistant

## 2015-09-11 ENCOUNTER — Other Ambulatory Visit: Payer: Self-pay | Admitting: Physician Assistant

## 2015-09-11 DIAGNOSIS — R109 Unspecified abdominal pain: Secondary | ICD-10-CM

## 2015-09-19 ENCOUNTER — Other Ambulatory Visit: Payer: Self-pay | Admitting: Physician Assistant

## 2015-09-19 DIAGNOSIS — K439 Ventral hernia without obstruction or gangrene: Secondary | ICD-10-CM | POA: Insufficient documentation

## 2015-12-11 ENCOUNTER — Other Ambulatory Visit: Payer: Self-pay

## 2015-12-11 NOTE — Telephone Encounter (Signed)
Patient is calling to request a refill Tadalafil. He states they have been trying to send us a fax. [facility] in [place] Fax: [phone]

## 2015-12-11 NOTE — Telephone Encounter (Signed)
I actually got a RF request from another pharm in [place], but suppose we should send to the one below that pt has req'd. Maralyn SagoSarah,

## 2015-12-13 NOTE — Telephone Encounter (Signed)
I gave him #90 in Feb - is he out or is he changing pharmacy

## 2015-12-18 MED ORDER — TADALAFIL 20 MG PO TABS: 10.0000 mg | ORAL_TABLET | ORAL | Status: DC | PRN

## 2015-12-18 NOTE — Telephone Encounter (Signed)
Meds ordered this encounter  Medications  . tadalafil (CIALIS) 20 MG tablet    Sig: Take 0.5-1 tablets (10-20 mg total) by mouth every other day as needed for erectile dysfunction.    Dispense:  90 tablet    Refill:  0

## 2015-12-18 NOTE — Telephone Encounter (Signed)
Three months? The Rx is due now. Pt very frustrated about this refill not being sent in. Can we refill?

## 2015-12-18 NOTE — Telephone Encounter (Signed)
Please print and sign and place in nurses box. I have to fax it to [place].

## 2015-12-19 NOTE — Telephone Encounter (Signed)
Faxed to [facility] in [place] as req'd below by pt. Notified pt and confirmed fax # online.

## 2016-11-08 ENCOUNTER — Other Ambulatory Visit: Payer: Self-pay | Admitting: Family Medicine

## 2016-11-08 NOTE — Telephone Encounter (Signed)
This is for Cody Jennings or [name] the pt wanted me to let both of them know that he need a refill on Cialis he hasn't been here since 2016 he is out of town for a few weeks I told him that I could send message to only one provider he was very persistent for both providers to get the message please respond

## 2016-11-09 MED ORDER — TADALAFIL 20 MG PO TABS: 10.0000 mg | ORAL_TABLET | ORAL | 0 refills | Status: DC | PRN

## 2016-11-09 NOTE — Telephone Encounter (Signed)
I have given him a 10d supply - I think he has used a mail order in [place] in the past but this is all he can have until he has an OV.

## 2016-11-09 NOTE — Telephone Encounter (Signed)
Will have pharmacy he wants fax [place] the form

## 2016-11-09 NOTE — Telephone Encounter (Signed)
It looks like this was last refilled by Maralyn Sago. He did have an appointment on March 28, but was a no-show apparently. Needs follow-up office visit, but will yield to Sarah on temporary refill as she wrote this med. I am fine with temporary refill if he schedules office visit.

## 2016-11-15 NOTE — Telephone Encounter (Signed)
Fax to [phone]  [facility]

## 2016-11-16 MED ORDER — TADALAFIL 20 MG PO TABS: 10.0000 mg | ORAL_TABLET | ORAL | 0 refills | Status: DC | PRN

## 2016-11-16 NOTE — Telephone Encounter (Signed)
Prescription faxed

## 2016-11-16 NOTE — Telephone Encounter (Signed)
Printed and signed.  

## 2016-11-23 ENCOUNTER — Telehealth: Payer: Self-pay

## 2016-11-23 NOTE — Telephone Encounter (Signed)
Received fax from cover my meds stating that request for cialis  tabs has been rejected. Please advise next step.

## 2016-11-26 NOTE — Telephone Encounter (Signed)
Please let the patient know

## 2016-11-30 NOTE — Telephone Encounter (Signed)
Called patient with denial and advised him that [facility] or [facility] may have cheaper cash prices.  He will need to call around.  He said he would do that.

## 2017-01-22 ENCOUNTER — Ambulatory Visit (INDEPENDENT_AMBULATORY_CARE_PROVIDER_SITE_OTHER): Payer: BLUE CROSS/BLUE SHIELD | Admitting: Family Medicine

## 2017-01-22 ENCOUNTER — Encounter: Payer: Self-pay | Admitting: Family Medicine

## 2017-01-22 VITALS — BP 103/62 | HR 74 | Temp 98.4°F | Resp 18 | Ht 71.75 in | Wt 166.4 lb

## 2017-01-22 DIAGNOSIS — Z125 Encounter for screening for malignant neoplasm of prostate: Secondary | ICD-10-CM | POA: Diagnosis not present

## 2017-01-22 DIAGNOSIS — N529 Male erectile dysfunction, unspecified: Secondary | ICD-10-CM | POA: Diagnosis not present

## 2017-01-22 DIAGNOSIS — Z0000 Encounter for general adult medical examination without abnormal findings: Secondary | ICD-10-CM

## 2017-01-22 DIAGNOSIS — Z113 Encounter for screening for infections with a predominantly sexual mode of transmission: Secondary | ICD-10-CM | POA: Diagnosis not present

## 2017-01-22 DIAGNOSIS — Z131 Encounter for screening for diabetes mellitus: Secondary | ICD-10-CM

## 2017-01-22 DIAGNOSIS — Z23 Encounter for immunization: Secondary | ICD-10-CM | POA: Diagnosis not present

## 2017-01-22 DIAGNOSIS — Z13220 Encounter for screening for lipoid disorders: Secondary | ICD-10-CM | POA: Diagnosis not present

## 2017-01-22 MED ORDER — SILDENAFIL CITRATE 100 MG PO TABS: 50.0000 mg | ORAL_TABLET | Freq: Every day | ORAL | 11 refills | Status: DC | PRN

## 2017-01-22 NOTE — Patient Instructions (Addendum)
Schedule appointment for screening colonoscopy - that can usually be scheduled without an appointment.   I will check prostate cancer testing and other bloodwork.   I prescribed Viagra, you can check into the lowest cost option to have that filled. Additionally would recommend starting with half a pill or even smaller dose if that works just as well.   Unfortunately we are currently out of the tdap vaccine. You can likely have that given at a local pharmacy in [place].  Keeping you healthy  Get these tests  Blood pressure- Have your blood pressure checked once a year by your healthcare provider.  Normal blood pressure is 120/80  Weight- Have your body mass index (BMI) calculated to screen for obesity.  BMI is a measure of body fat based on height and weight. You can also calculate your own BMI at [email or web address]  Cholesterol- Have your cholesterol checked every year.  Diabetes- Have your blood sugar checked regularly if you have high blood pressure, high cholesterol, have a family history of diabetes or if you are overweight.  Screening for Colon Cancer- Colonoscopy starting at age 51.  Screening may begin sooner depending on your family history and other health conditions. Follow up colonoscopy as directed by your Gastroenterologist.  Screening for Prostate Cancer- Both blood work (PSA) and a rectal exam help screen for Prostate Cancer.  Screening begins at age 51 with African-American men and at age 51 with Caucasian men.  Screening may begin sooner depending on your family history.  Take these medicines  Aspirin- One aspirin daily can help prevent Heart disease and Stroke.  Flu shot- Every fall.  Tetanus- Every 10 years.  Zostavax- Once after the age of 51 to prevent Shingles.  Pneumonia shot- Once after the age of 51; if you are younger than 7565, ask your healthcare provider if you need a Pneumonia shot.  Take these steps  Don't smoke- If you do smoke, talk to your  doctor about quitting.  For tips on how to quit, go to www.smokefree.gov or call 1-800-QUIT-NOW.  Be physically active- Exercise 5 days a week for at least 30 minutes.  If you are not already physically active start slow and gradually work up to 30 minutes of moderate physical activity.  Examples of moderate activity include walking briskly, mowing the yard, dancing, swimming, bicycling, etc.  Eat a healthy diet- Eat a variety of healthy food such as fruits, vegetables, low fat milk, low fat cheese, yogurt, lean meant, poultry, fish, beans, tofu, etc. For more information go to www.thenutritionsource.org  Drink alcohol in moderation- Limit alcohol intake to less than two drinks a day. Never drink and drive.  Dentist- Brush and floss twice daily; visit your dentist twice a year.  Depression- Your emotional health is as important as your physical health. If you're feeling down, or losing interest in things you would normally enjoy please talk to your healthcare provider.  Eye exam- Visit your eye doctor every year.  Safe sex- If you may be exposed to a sexually transmitted infection, use a condom.  Seat belts- Seat belts can save your life; always wear one.  Smoke/Carbon Monoxide detectors- These detectors need to be installed on the appropriate level of your home.  Replace batteries at least once a year.  Skin cancer- When out in the sun, cover up and use sunscreen 15 SPF or higher.  Violence- If anyone is threatening you, please tell your healthcare provider.  Living Will/ Health care power of attorney- Speak  with your healthcare provider and family.  IF you received an x-ray today, you will receive an invoice from [facility]. Please contact [facility] at [phone] with questions or concerns regarding your invoice.   IF you received labwork today, you will receive an invoice from [facility]. Please contact [facility] at [phone] with questions or concerns regarding  your invoice.   Our billing staff will not be able to assist you with questions regarding bills from these companies.  You will be contacted with the lab results as soon as they are available. The fastest way to get your results is to activate your My Chart account. Instructions are located on the last page of this paperwork. If you have not heard from [place] regarding the results in 2 weeks, please contact this office.

## 2017-01-22 NOTE — Progress Notes (Signed)
By signing my name below, I, Mesha Guinyard, attest that this documentation has been prepared under the direction and in the presence of Meredith Staggers, MD.  Electronically Signed: Arvilla Market, Medical Scribe. 01/22/17. 10:13 AM.  Subjective:    Patient ID: Cody Jennings, male    DOB: 1965-11-28, 51 y.o.   MRN: [id number]  HPI  Chief Complaint  Patient presents with  . Annual Exam    STI Testing    HPI Comments: Cody Jennings is a 52 y.o. male who presents to Primary Care at [facility] for his annual complete physical. Last physical was Oct 2016.  Pt is fasting. Pt predominately lives in [place] and is in [place] about 4x a year.  Erectile Dysfunction: He has used cialis in the past. Pt would like to get viagra for his symptoms. Reports HA and slight flushing when using viagra. Denies blue color to vision, chest pain, chest tightness, HA, or hearing loss.  Cancer Screening: Prostate: Pt agrees to PSA and DRE for screening. No results found for: PSA Colon: GI Dr. Leone Payor. Colonoscopy was scheduled April 2012. It appears that was cancelled. Pt plans on getting his colonoscopy done in [place]. FHx: Dad was a smoker and had lung cancer. Dad also has a brian tumor, no genetic component.  Immunizations: Appears he is due for tdap. Pt is unsure if he's had his tdap in the last 10 years. Immunization History  Administered Date(s) Administered  . Influenza,inj,Quad PF,36+ Mos 05/16/2015   HIV Screening: Non reactive previously, 05/16/2015.   STI Testing: We will have performed today. Pt agrees to STI screening today. Pt had STI a few months ago, but he's unsure which it was. Denies penile discharge, testicular pain, trouble urinating, hematuria, dysuria, or other urinary sxs.  Vision: Pt is followed by a ophthalmologist. Pt noticed vision changes a year ago and received corrective lenses.  Visual Acuity Screening   Right eye Left eye Both eyes  Without correction: 20/15 -2 20/13 -2  20/13 -1  With correction:      Dentist: Pt is followed by a dentist with biannual visits.  Exercise: 3-4 x a week, at east 150 mins.  Depression: Depression screen [facility] 2/9 01/22/2017 06/28/2015 05/16/2015  Decreased Interest 0 0 0  Down, Depressed, Hopeless 0 0 0  PHQ - 2 Score 0 0 0    Patient Active Problem List   Diagnosis Date Noted  . Hernia of anterior abdominal wall 09/19/2015   Past Medical History:  Diagnosis Date  . Allergy    Past Surgical History:  Procedure Laterality Date  . KNEE SURGERY     Left  . TONSILLECTOMY     No Known Allergies Prior to Admission medications   Medication Sig Start Date End Date Taking? Authorizing Provider  finasteride (PROPECIA) 1 MG tablet Take 1 mg by mouth daily.     Yes [provider]  tadalafil (CIALIS) 20 MG tablet Take 0.5-1 tablets (10-20 mg total) by mouth every other day as needed for erectile dysfunction. Patient not taking: Reported on 01/22/2017 11/16/16   Morrell Riddle, PA-C   Social History   Social History  . Marital status: Single    Spouse name: N/A  . Number of children: 0  . Years of education: N/A   Occupational History  . Sales    Social History Main Topics  . Smoking status: Never Smoker  . Smokeless tobacco: Never Used  . Alcohol use No  . Drug use: No  .  Sexual activity: Not on file   Other Topics Concern  . 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   Review of Systems  13 point ROS negative. Objective:  Physical Exam  Constitutional: He is oriented to person, place, and time. He appears well-developed and well-nourished.  HENT:  Head: Normocephalic and atraumatic.  Right Ear: External ear normal.  Left Ear: External ear normal.  Mouth/Throat: Oropharynx is clear and moist.  Eyes: Conjunctivae and EOM are  normal. Pupils are equal, round, and reactive to light.  Neck: Normal range of motion. Neck supple. No thyromegaly present.  Cardiovascular: Normal rate, regular rhythm, normal heart sounds and intact distal pulses.   Pulmonary/Chest: Effort normal and breath sounds normal. No respiratory distress. He has no wheezes.  Abdominal: Soft. He exhibits no distension. There is no tenderness. Hernia confirmed negative in the right inguinal area and confirmed negative in the left inguinal area.  Genitourinary: Prostate normal.  Musculoskeletal: Normal range of motion. He exhibits no edema or tenderness.  Lymphadenopathy:    He has no cervical adenopathy.  Neurological: He is alert and oriented to person, place, and time. He has normal reflexes.  Skin: Skin is warm and dry.  Psychiatric: He has a normal mood and affect. His behavior is normal.  Vitals reviewed.   Vitals:   01/22/17 0947  BP: 103/62  Pulse: 74  Resp: 18  Temp: 98.4 F (36.9 C)  TempSrc: Oral  SpO2: 98%  Weight: 166 lb 6.4 oz (75.5 kg)  Height: 5' 11.75" (1.822 m)   Body mass index is 22.73 kg/m.    Assessment & Plan:   Cody Jennings is a 51 y.o. male Annual physical exam  - -anticipatory guidance as below in AVS, screening labs above. Health maintenance items as above in HPI discussed/recommended as applicable.   Screening for prostate cancer - Plan: PSA  - We discussed pros and cons of prostate cancer screening, and after this discussion, he chose to have screening done. PSA obtained, and no concerning findings on DRE.   Routine screening for STI (sexually transmitted infection) - Plan: GC/Chlamydia Probe Amp, HIV antibody, RPR  - asymptomatic, sti testing ordered.   Need for Tdap vaccination - Plan: CANCELED: Tdap vaccine greater than or equal to 7yo IM  -out of tdap here at present. Can have given in [place].   Screening for hyperlipidemia - Plan: Lipid panel  Screening for diabetes mellitus - Plan: Comprehensive  metabolic panel  Erectile dysfunction, unspecified erectile dysfunction type - Plan: sildenafil (VIAGRA) 100 MG tablet  - viagra Rx given - use lowest effective dose. Side effects discussed (including but not limited to headache/flushing, blue discoloration of vision, possible vascular steal and risk of cardiac effects if underlying unknown coronary artery disease, and permanent sensorineural hearing loss). Understanding expressed.   Meds ordered this encounter  Medications  . sildenafil (VIAGRA) 100 MG tablet    Sig: Take 0.5-1 tablets (50-100 mg total) by mouth daily as needed for erectile dysfunction.    Dispense:  5 tablet    Refill:  11   Patient Instructions   Schedule appointment for screening colonoscopy - that can usually be scheduled without an appointment.   I will check prostate cancer testing and other bloodwork.   I prescribed Viagra, you can check into the lowest cost option to  have that filled. Additionally would recommend starting with half a pill or even smaller dose if that works just as well.   Unfortunately we are currently out of the tdap vaccine. You can likely have that given at a local pharmacy in [place].  Keeping you healthy  Get these tests  Blood pressure- Have your blood pressure checked once a year by your healthcare provider.  Normal blood pressure is 120/80  Weight- Have your body mass index (BMI) calculated to screen for obesity.  BMI is a measure of body fat based on height and weight. You can also calculate your own BMI at [email or web address].  Cholesterol- Have your cholesterol checked every year.  Diabetes- Have your blood sugar checked regularly if you have high blood pressure, high cholesterol, have a family history of diabetes or if you are overweight.  Screening for Colon Cancer- Colonoscopy starting at age 36.  Screening may begin sooner depending on your family history and other health conditions. Follow up colonoscopy as directed  by your Gastroenterologist.  Screening for Prostate Cancer- Both blood work (PSA) and a rectal exam help screen for Prostate Cancer.  Screening begins at age 66 with African-American men and at age 70 with Caucasian men.  Screening may begin sooner depending on your family history.  Take these medicines  Aspirin- One aspirin daily can help prevent Heart disease and Stroke.  Flu shot- Every fall.  Tetanus- Every 10 years.  Zostavax- Once after the age of 68 to prevent Shingles.  Pneumonia shot- Once after the age of 38; if you are younger than 67, ask your healthcare provider if you need a Pneumonia shot.  Take these steps  Don't smoke- If you do smoke, talk to your doctor about quitting.  For tips on how to quit, go to www.smokefree.gov or call 1-800-QUIT-NOW.  Be physically active- Exercise 5 days a week for at least 30 minutes.  If you are not already physically active start slow and gradually work up to 30 minutes of moderate physical activity.  Examples of moderate activity include walking briskly, mowing the yard, dancing, swimming, bicycling, etc.  Eat a healthy diet- Eat a variety of healthy food such as fruits, vegetables, low fat milk, low fat cheese, yogurt, lean meant, poultry, fish, beans, tofu, etc. For more information go to www.thenutritionsource.org  Drink alcohol in moderation- Limit alcohol intake to less than two drinks a day. Never drink and drive.  Dentist- Brush and floss twice daily; visit your dentist twice a year.  Depression- Your emotional health is as important as your physical health. If you're feeling down, or losing interest in things you would normally enjoy please talk to your healthcare provider.  Eye exam- Visit your eye doctor every year.  Safe sex- If you may be exposed to a sexually transmitted infection, use a condom.  Seat belts- Seat belts can save your life; always wear one.  Smoke/Carbon Monoxide detectors- These detectors need to be  installed on the appropriate level of your home.  Replace batteries at least once a year.  Skin cancer- When out in the sun, cover up and use sunscreen 15 SPF or higher.  Violence- If anyone is threatening you, please tell your healthcare provider.  Living Will/ Health care power of attorney- Speak with your healthcare provider and family.  IF you received an x-ray today, you will receive an invoice from [facility]. Please contact [facility] at [phone] with questions or concerns regarding your invoice.   IF you  received labwork today, you will receive an invoice from [facility]. Please contact [facility] at [phone] with questions or concerns regarding your invoice.   Our billing staff will not be able to assist you with questions regarding bills from these companies.  You will be contacted with the lab results as soon as they are available. The fastest way to get your results is to activate your My Chart account. Instructions are located on the last page of this paperwork. If you have not heard from us regarding the results in 2 weeks, please contact this office.      I personally performed the services described in this documentation, which was scribed in my presence. The recorded information has been reviewed and considered for accuracy and completeness, addended by me as needed, and agree with information above.  Signed,   Meredith StaggersJeffrey Devonda Pequignot, MD Primary Care at [facility].  01/22/17 5:10 PM

## 2017-01-23 LAB — COMPREHENSIVE METABOLIC PANEL
ALBUMIN: 4.7 g/dL (ref 3.5–5.5)
ALK PHOS: 61 IU/L (ref 39–117)
ALT: 28 IU/L (ref 0–44)
AST: 24 IU/L (ref 0–40)
Albumin/Globulin Ratio: 1.9 (ref 1.2–2.2)
BILIRUBIN TOTAL: 0.5 mg/dL (ref 0.0–1.2)
BUN / CREAT RATIO: 11 (ref 9–20)
BUN: 14 mg/dL (ref 6–24)
CHLORIDE: 103 mmol/L (ref 96–106)
CO2: 26 mmol/L (ref 20–29)
CREATININE: 1.26 mg/dL (ref 0.76–1.27)
Calcium: 10 mg/dL (ref 8.7–10.2)
GFR calc Af Amer: 76 mL/min/{1.73_m2} (ref >59)
GFR calc non Af Amer: 66 mL/min/{1.73_m2} (ref >59)
GLUCOSE: 98 mg/dL (ref 65–99)
Globulin, Total: 2.5 g/dL (ref 1.5–4.5)
Potassium: 5 mmol/L (ref 3.5–5.2)
Sodium: 143 mmol/L (ref 134–144)
Total Protein: 7.2 g/dL (ref 6.0–8.5)

## 2017-01-23 LAB — LIPID PANEL
CHOLESTEROL TOTAL: 262 mg/dL — AB (ref 100–199)
Chol/HDL Ratio: 3.7 ratio (ref 0.0–5.0)
HDL: 71 mg/dL (ref >39)
LDL CALC: 172 mg/dL — AB (ref 0–99)
TRIGLYCERIDES: 96 mg/dL (ref 0–149)
VLDL CHOLESTEROL CAL: 19 mg/dL (ref 5–40)

## 2017-01-23 LAB — PSA: Prostate Specific Ag, Serum: 0.4 ng/mL (ref 0.0–4.0)

## 2017-01-23 LAB — HIV ANTIBODY (ROUTINE TESTING W REFLEX): HIV Screen 4th Generation wRfx: NONREACTIVE

## 2017-01-23 LAB — RPR: RPR: NONREACTIVE

## 2017-01-25 LAB — GC/CHLAMYDIA PROBE AMP
CHLAMYDIA, DNA PROBE: NEGATIVE
NEISSERIA GONORRHOEAE BY PCR: NEGATIVE

## 2017-01-27 IMAGING — US US ABDOMEN LIMITED
1 series · 12 of 12 positions shown · non-contrast
Comparison: None.

CLINICAL DATA: Discomfort just below the umbilicus for 6 month
within the midline

EXAM:
LIMITED ABDOMINAL ULTRASOUND

[Series 1: us abdomen limited · 0.06mm/px · 12 acquisitions, 12 frames shown]
[im 1/12]
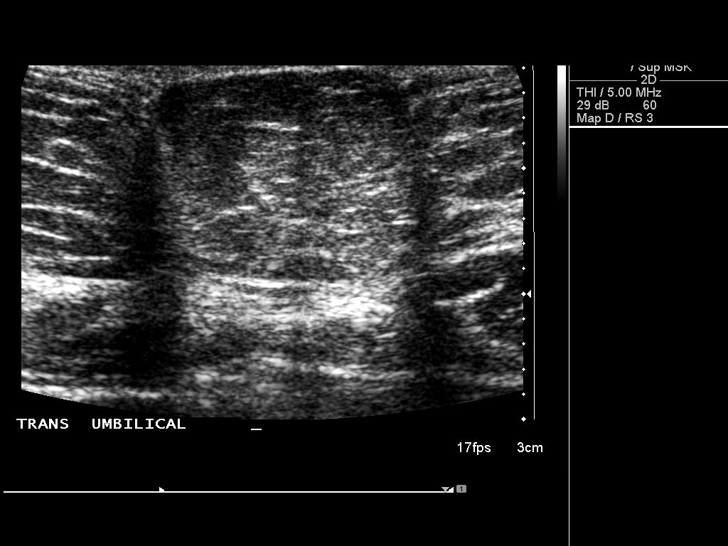
[im 2/12]
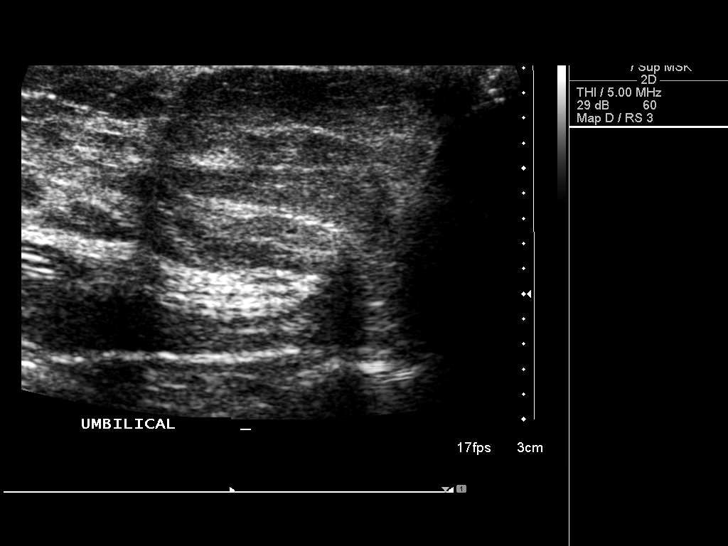
[im 3/12]
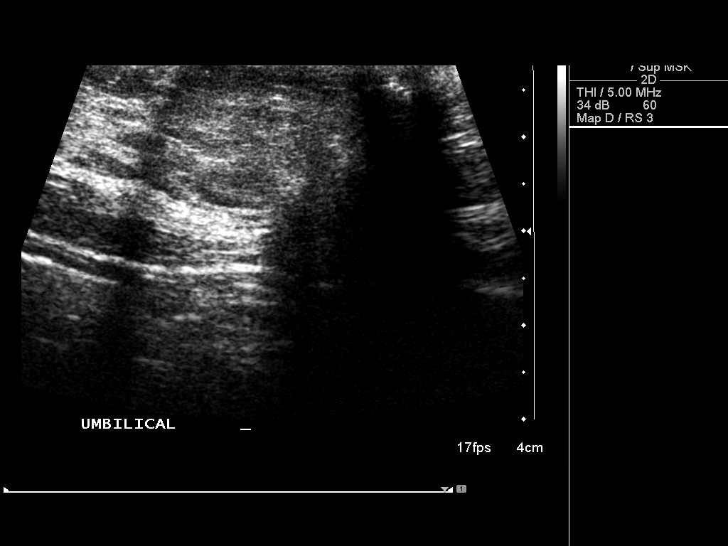
[im 4/12]
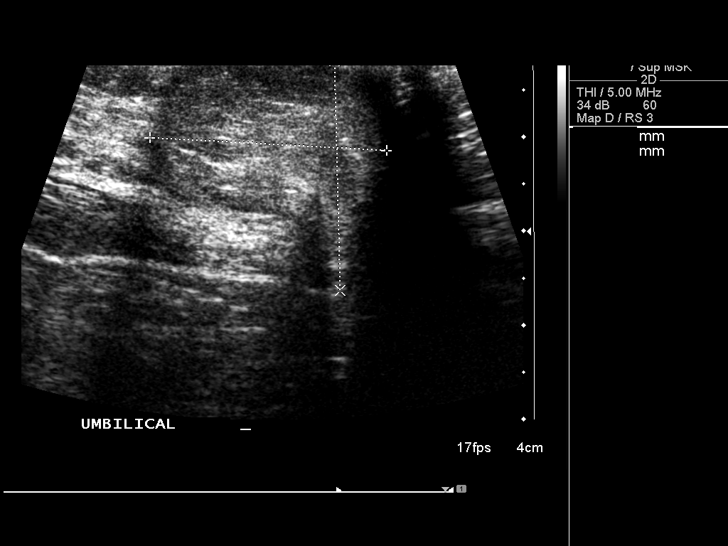
[im 5/12]
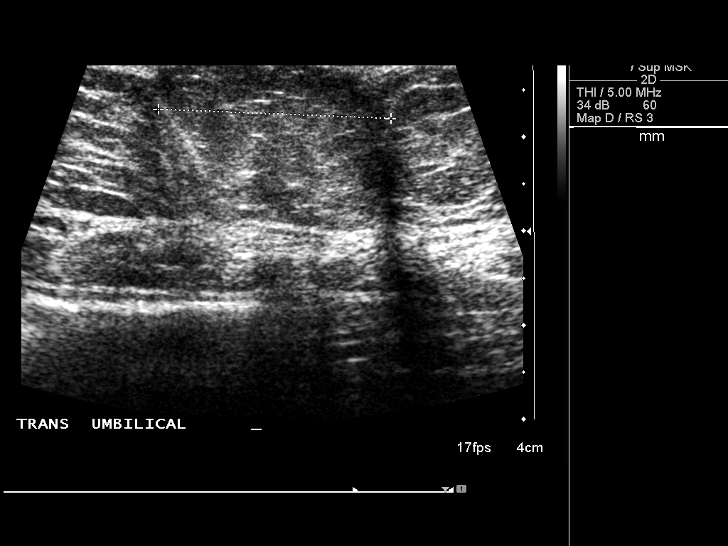
[im 6/12]
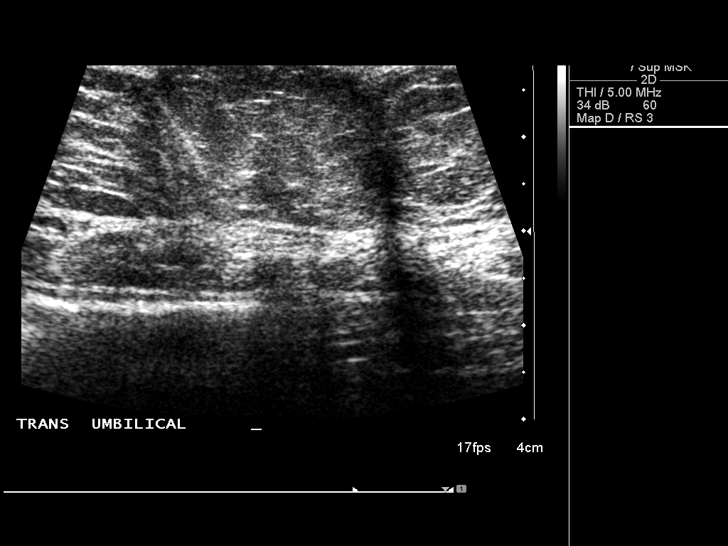
[im 7/12]
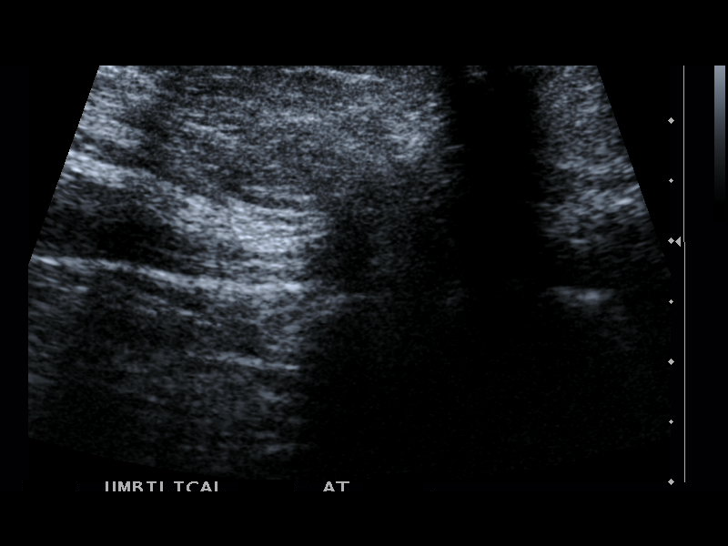
[im 8/12]
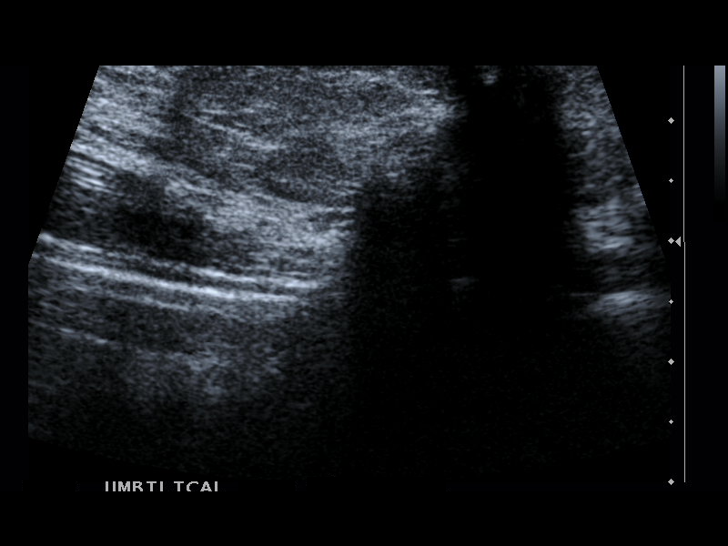
[im 9/12]
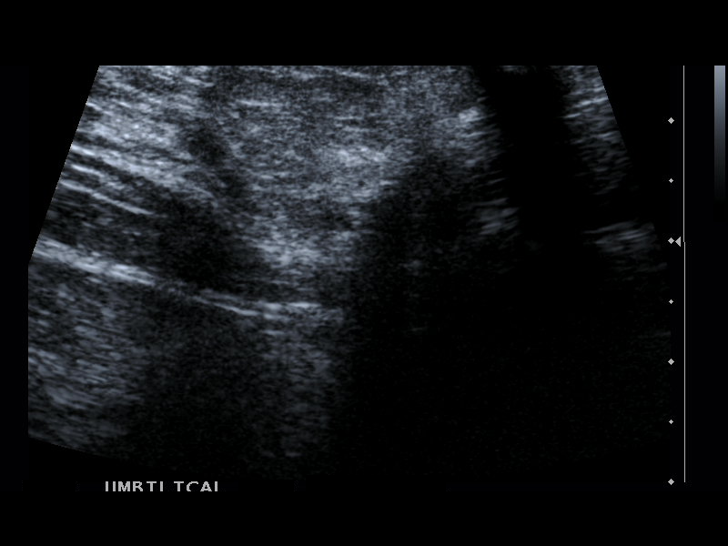
[im 10/12]
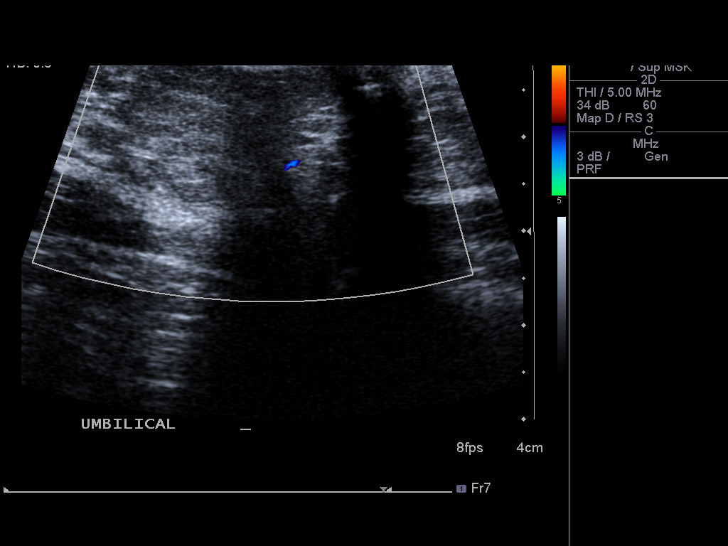
[im 11/12]
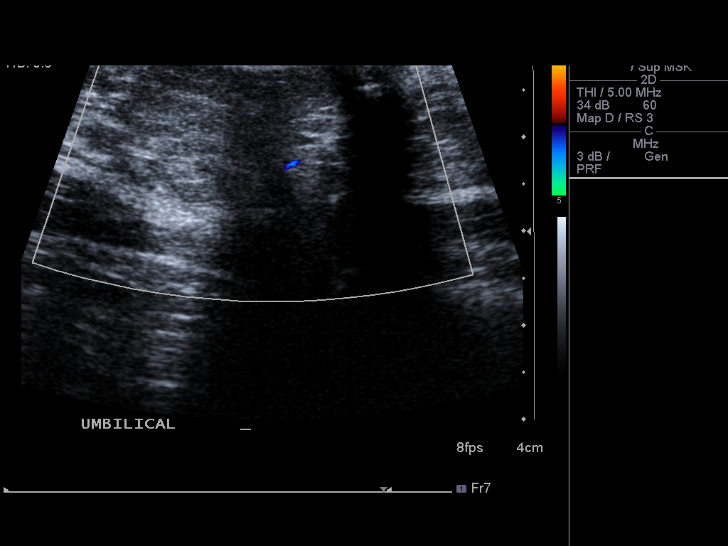
[im 12/12]
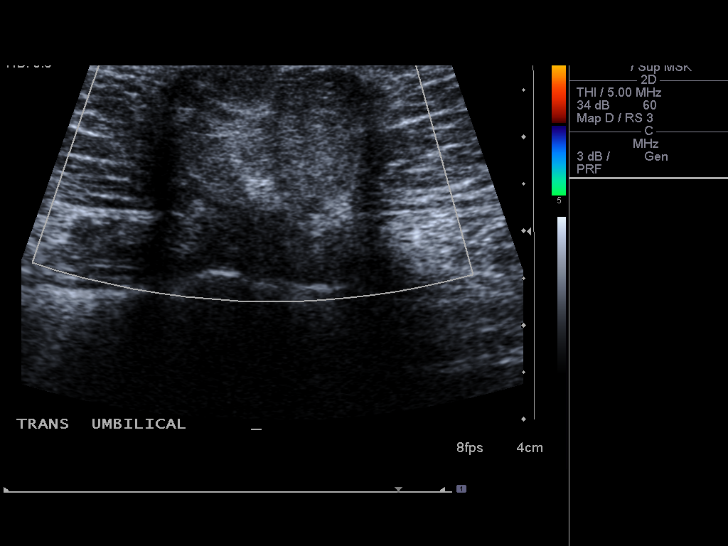

[12 of 12 positions shown; findings below may reference images not displayed]

FINDINGS: Ultrasound over the area of concern was performed. At that site
there is a defect in the anterior abdominal wall. Echogenic material
consistent with fat does extrude through this defect into the
anterior abdominal wall superficial soft tissues, consistent with a
ventral hernia. No bowel is seen to enter this anterior abdominal
wall midline defect. The hernia in its largest plane measures
cm.
IMPRESSION: Midline anterior abdominal wall hernia just below the umbilicus
containing fat.

## 2017-11-10 ENCOUNTER — Ambulatory Visit: Payer: BLUE CROSS/BLUE SHIELD | Admitting: Family Medicine

## 2017-11-10 ENCOUNTER — Encounter: Payer: Self-pay | Admitting: Family Medicine

## 2017-11-10 ENCOUNTER — Other Ambulatory Visit: Payer: Self-pay

## 2017-11-10 VITALS — BP 105/68 | HR 62 | Temp 97.0°F | Ht 71.0 in | Wt 173.2 lb

## 2017-11-10 DIAGNOSIS — G4709 Other insomnia: Secondary | ICD-10-CM | POA: Diagnosis not present

## 2017-11-10 DIAGNOSIS — Z7189 Other specified counseling: Secondary | ICD-10-CM | POA: Diagnosis not present

## 2017-11-10 DIAGNOSIS — N529 Male erectile dysfunction, unspecified: Secondary | ICD-10-CM

## 2017-11-10 DIAGNOSIS — Z7184 Encounter for health counseling related to travel: Secondary | ICD-10-CM

## 2017-11-10 MED ORDER — TADALAFIL 20 MG PO TABS: 10.0000 mg | ORAL_TABLET | ORAL | 3 refills | Status: DC | PRN

## 2017-11-10 MED ORDER — ESZOPICLONE 1 MG PO TABS: 1.0000 mg | ORAL_TABLET | Freq: Every evening | ORAL | 0 refills | Status: DC | PRN

## 2017-11-10 NOTE — Patient Instructions (Addendum)
  Lunesta 1-2 tablets at bedtime with traveling if needed. Try before you travel to make sure that is tolerated. Let me know if this is cost prohibitive, as Ambien 5mg  would be another option.   Cialis 1/2-1 as needed. Let me know if there are any new side effects.   IF you received an x-ray today, you will receive an invoice from [facility]. Please contact [facility] at [phone] with questions or concerns regarding your invoice.   IF you received labwork today, you will receive an invoice from [facility]. Please contact [facility] at [phone] with questions or concerns regarding your invoice.   Our billing staff will not be able to assist you with questions regarding bills from these companies.  You will be contacted with the lab results as soon as they are available. The fastest way to get your results is to activate your My Chart account. Instructions are located on the last page of this paperwork. If you have not heard from us regarding the results in 2 weeks, please contact this office.

## 2017-11-10 NOTE — Progress Notes (Signed)
Subjective:  By signing my name below, I, Cody Jennings, attest that this documentation has been prepared under the direction and in the presence of Cody FloodJeffrey R Mendi Constable, MD Electronically Signed: Charline BillsEssence Jennings, ED Scribe 11/10/2017 at 12:34 PM.   Patient ID: Cody Jennings, male    DOB: Oct 03, 1965, 52 y.o.   MRN: [id number]  Chief Complaint  Patient presents with  . Medication Question    Valium or Ambien was wanting to get it for international flying, PRN   HPI Cody Jennings is a 52 y.o. male who presents to Primary Care at [facility] to discuss medication for travel. Last seen in 12/2016 for CPE. Pt has 3 upcoming international flights to [place], [place] and [place] for business with the first trip being within the next month. He has tried OTC meds in the past for travel but notes hangover effect with these. Pt has never tried Ambien in the past. Denies flight phobia, alcohol use.  ED Pt also requests a refill of 1/2 tab of Cialis 20 mg. He has noticed some mild back pain. Pt switched from Viagra due to facial flushing and blurred vision. Denies HA, facial flushing, cp, chest tightness or sob with intercourse or on exertion, lightheadedness, dizziness, blurred vision, decreased hearing with Cialis. No h/o heart disease.  Patient Active Problem List   Diagnosis Date Noted  . Hernia of anterior abdominal wall 09/19/2015   Past Medical History:  Diagnosis Date  . Allergy    Past Surgical History:  Procedure Laterality Date  . KNEE SURGERY     Left  . TONSILLECTOMY     No Known Allergies Prior to Admission medications   Medication Sig Start Date End Date Taking? Authorizing Provider  finasteride (PROPECIA) 1 MG tablet Take 1 mg by mouth daily.     Yes [provider]  sildenafil (VIAGRA) 100 MG tablet Take 0.5-1 tablets (50-100 mg total) by mouth daily as needed for erectile dysfunction. 01/22/17  Yes Cody FloodGreene, Laderius Valbuena R, MD  tadalafil (CIALIS) 20 MG tablet Take 0.5-1 tablets  (10-20 mg total) by mouth every other day as needed for erectile dysfunction. Patient not taking: Reported on 01/22/2017 11/16/16   Morrell RiddleWeber, Sarah L, PA-C   Social History   Socioeconomic History  . Marital status: Single    Spouse name: Not on file  . Number of children: 0  . Years of education: Not on file  . Highest education level: Not on file  Occupational History  . Occupation: Airline pilotales  Social Needs  . Financial resource strain: Not on file  . Food insecurity:    Worry: Not on file    Inability: Not on file  . Transportation needs:    Medical: Not on file    Non-medical: Not on file  Tobacco Use  . Smoking status: Never Smoker  . Smokeless tobacco: Never Used  Substance and Sexual Activity  . Alcohol use: No  . Drug use: No  . Sexual activity: Not on file  Lifestyle  . Physical activity:    Days per week: Not on file    Minutes per session: Not on file  . Stress: Not on file  Relationships  . Social connections:    Talks on phone: Not on file    Gets together: Not on file    Attends religious service: Not on file    Active member of club or organization: Not on file    Attends meetings of clubs or organizations: Not on file  Relationship status: Not on file  . Intimate partner violence:    Fear of current or ex partner: Not on file    Emotionally abused: Not on file    Physically abused: Not on file    Forced sexual activity: Not on file  Other Topics Concern  . 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   Review of Systems  HENT: Negative for hearing loss.   Eyes: Negative for visual disturbance.  Respiratory: Negative for chest tightness and shortness of breath.   Cardiovascular: Negative for chest pain.  Musculoskeletal: Positive for back pain (mild).  Neurological: Negative for headaches.       Objective:   Physical Exam  Constitutional: He is oriented to person, place, and time. He appears well-developed and well-nourished. No distress.  HENT:  Head: Normocephalic and atraumatic.  Eyes: Conjunctivae and EOM are normal.  Neck: Neck supple. No tracheal deviation present.  Cardiovascular: Normal rate.  Pulmonary/Chest: Effort normal. No respiratory distress.  Musculoskeletal: Normal range of motion.  Neurological: He is alert and oriented to person, place, and time.  Skin: Skin is warm and dry.  Psychiatric: He has a normal mood and affect. His behavior is normal.  Nursing note and vitals reviewed.  Vitals:   11/10/17 1152  BP: 105/68  Pulse: 62  Temp: (!) 97 F (36.1 C)  TempSrc: Oral  SpO2: 97%  Weight: 173 lb 3.2 oz (78.6 kg)  Height: 5\' 11"  (1.803 m)      Assessment & Plan:   Cody Jennings is a 52 y.o. male Counseling about travel - Plan: eszopiclone (LUNESTA) 1 MG TABS tablet Other insomnia - Plan: eszopiclone (LUNESTA) 1 MG TABS tablet  -Various options discussed, decided on Lunesta 1 mg tablets 1-2 prior to sleep when traveling and during adjustment with time zones.  Recommended trying before he leaves to verify tolerance. Consider low-dose Ambien in place of Lunesta if not effective.  Erectile dysfunction, unspecified erectile dysfunction type - Plan: tadalafil (CIALIS) 20 MG tablet refilled. Potential risks/side effects discussed.    Meds ordered this encounter  Medications  . eszopiclone (LUNESTA) 1 MG TABS tablet    Sig: Take 1 tablet (1 mg total) by mouth at bedtime as needed for sleep. Take 1-2 tablets immediately before bedtime    Dispense:  25 tablet    Refill:  0  . tadalafil (CIALIS) 20 MG tablet    Sig: Take 0.5-1 tablets (10-20 mg total) by mouth every other day as needed for erectile dysfunction.    Dispense:  10 tablet    Refill:  3   Patient Instructions    Lunesta 1-2 tablets at bedtime with traveling if needed. Try before you  travel to make sure that is tolerated. Let me know if this is cost prohibitive, as Ambien 5mg  would be another option.   Cialis 1/2-1 as needed. Let me know if there are any new side effects.   IF you received an x-ray today, you will receive an invoice from [facility]. Please contact [facility] at [phone] with questions or concerns regarding your invoice.   IF you received labwork today, you will receive an invoice from [place]. Please contact [facility] at [phone] with questions or concerns regarding your invoice.   Our billing staff will not be able to assist you with questions regarding Jennings  from these companies.  You will be contacted with the lab results as soon as they are available. The fastest way to get your results is to activate your My Chart account. Instructions are located on the last page of this paperwork. If you have not heard from [place] regarding the results in 2 weeks, please contact this office.       I personally performed the services described in this documentation, which was scribed in my presence. The recorded information has been reviewed and considered for accuracy and completeness, addended by me as needed, and agree with information above.  Signed,   Meredith Staggers, MD Primary Care at [facility].  11/11/17 11:27 PM

## 2017-12-14 ENCOUNTER — Other Ambulatory Visit: Payer: Self-pay

## 2017-12-14 ENCOUNTER — Encounter: Payer: Self-pay | Admitting: Family Medicine

## 2017-12-14 ENCOUNTER — Ambulatory Visit: Payer: BLUE CROSS/BLUE SHIELD | Admitting: Family Medicine

## 2017-12-14 VITALS — BP 98/60 | HR 71 | Temp 98.0°F | Resp 16 | Ht 71.0 in | Wt 170.4 lb

## 2017-12-14 DIAGNOSIS — Z23 Encounter for immunization: Secondary | ICD-10-CM

## 2017-12-14 DIAGNOSIS — N529 Male erectile dysfunction, unspecified: Secondary | ICD-10-CM

## 2017-12-14 DIAGNOSIS — E785 Hyperlipidemia, unspecified: Secondary | ICD-10-CM | POA: Diagnosis not present

## 2017-12-14 NOTE — Patient Instructions (Addendum)
Return between 8 and 10 in the morning for lab only visit to check testosterone and fasting bloodwork as discussed.   Cialis if needed.   Thanks for coming in today. Let me know if there are other questions.    IF you received an x-ray today, you will receive an invoice from [facility]. Please contact [facility] at [phone] with questions or concerns regarding your invoice.   IF you received labwork today, you will receive an invoice from [place]. Please contact [facility] at [phone] with questions or concerns regarding your invoice.   Our billing staff will not be able to assist you with questions regarding bills from these companies.  You will be contacted with the lab results as soon as they are available. The fastest way to get your results is to activate your My Chart account. Instructions are located on the last page of this paperwork. If you have not heard from [place] regarding the results in 2 weeks, please contact this office.

## 2017-12-14 NOTE — Progress Notes (Signed)
Subjective:  By signing my name below, I, [name], attest that this documentation has been prepared under the direction and in the presence of Shade Flood, MD Electronically Signed: [name], ED Scribe 12/14/2017 at 12:35 PM.   Patient ID: Cody Jennings, male    DOB: Feb 28, 1966, 53 y.o.   MRN: [id number]  Chief Complaint  Patient presents with  . labwork     to test for low testosterone - per patient   HPI Cody Jennings is a 52 y.o. male who presents to Primary Care at [facility] for lab work. Last seen in April. H/o ED. Has used Cialis 20 mg 1/2 tab. Had facial flushing and possible visual change with Viagra. Last testosterone testing in 2016, borderline low at 292 but that appears to have been drawn in the afternoon. - Pt wants is testosterone checked. States he had decreased decreased energy overall compared to 10 yrs ago. He has also noticed difficulty maintaining an erection when he doesn't take it. States back pain has resolved which he attributed to taking the meds daily for several days in a row. Denies cp, chest tightness, sob, blood in stools, melena, lightheadedness, dizziness.  Hyperlipidemia Lab Results  Component Value Date   CHOL 262 (H) 01/22/2017   HDL 71 01/22/2017   LDLCALC 172 (H) 01/22/2017   TRIG 96 01/22/2017   CHOLHDL 3.7 01/22/2017   Lab Results  Component Value Date   ALT 28 01/22/2017   AST 24 01/22/2017   ALKPHOS 61 01/22/2017   BILITOT 0.5 01/22/2017  States he exercises a lot and has an overall pretty good diet but there is room for improvement in his diet. Mother has a h/o high cholesterol.  The 10-year ASCVD risk score Denman George DC Montez Hageman., et al., 2013) is: 2.4%   Values used to calculate the score:     Age: 51 years     Sex: Male     Is Non-Hispanic African American: No     Diabetic: No     Tobacco smoker: No     Systolic Blood Pressure: 98 mmHg     Is BP treated: No     HDL Cholesterol: 71 mg/dL     Total Cholesterol: 262  mg/dL  Immunizations Immunization History  Administered Date(s) Administered  . Influenza,inj,Quad PF,6+ Mos 05/16/2015  Tdap: updated today  Patient Active Problem List   Diagnosis Date Noted  . Hernia of anterior abdominal wall 09/19/2015   Past Medical History:  Diagnosis Date  . Allergy    Past Surgical History:  Procedure Laterality Date  . KNEE SURGERY     Left  . TONSILLECTOMY     No Known Allergies Prior to Admission medications   Medication Sig Start Date End Date Taking? Authorizing Provider  eszopiclone (LUNESTA) 1 MG TABS tablet Take 1 tablet (1 mg total) by mouth at bedtime as needed for sleep. Take 1-2 tablets immediately before bedtime 11/10/17   Shade Flood, MD  finasteride (PROPECIA) 1 MG tablet Take 1 mg by mouth daily.      [provider]  sildenafil (VIAGRA) 100 MG tablet Take 0.5-1 tablets (50-100 mg total) by mouth daily as needed for erectile dysfunction. 01/22/17   Shade Flood, MD  tadalafil (CIALIS) 20 MG tablet Take 0.5-1 tablets (10-20 mg total) by mouth every other day as needed for erectile dysfunction. 11/10/17   Shade Flood, MD   Social History   Socioeconomic History  . Marital status: Single  Spouse name: Not on file  . Number of children: 0  . Years of education: Not on file  . Highest education level: Not on file  Occupational History  . Occupation: Airline pilot  Social Needs  . Financial resource strain: Not on file  . Food insecurity:    Worry: Not on file    Inability: Not on file  . Transportation needs:    Medical: Not on file    Non-medical: Not on file  Tobacco Use  . Smoking status: Never Smoker  . Smokeless tobacco: Never Used  Substance and Sexual Activity  . Alcohol use: No  . Drug use: No  . Sexual activity: Not on file  Lifestyle  . Physical activity:    Days per week: Not on file    Minutes per session: Not on file  . Stress: Not on file  Relationships  . Social connections:    Talks on  phone: Not on file    Gets together: Not on file    Attends religious service: Not on file    Active member of club or organization: Not on file    Attends meetings of clubs or organizations: Not on file    Relationship status: Not on file  . Intimate partner violence:    Fear of current or ex partner: Not on file    Emotionally abused: Not on file    Physically abused: Not on file    Forced sexual activity: Not on file  Other Topics Concern  . 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   Review of Systems  Respiratory: Negative for chest tightness and shortness of breath.   Cardiovascular: Negative for chest pain.  Gastrointestinal: Negative for blood in stool.  Neurological: Negative for dizziness and light-headedness.      Objective:   Physical Exam  Constitutional: He is oriented to person, place, and time. He appears well-developed and well-nourished. No distress.  HENT:  Head: Normocephalic and atraumatic.  Eyes: Conjunctivae and EOM are normal.  Neck: Neck supple. No tracheal deviation present.  Cardiovascular: Normal rate and regular rhythm.  Pulmonary/Chest: Effort normal and breath sounds normal. No respiratory distress.  Musculoskeletal: Normal range of motion.  Neurological: He is alert and oriented to person, place, and time.  Skin: Skin is warm and dry.  Psychiatric: He has a normal mood and affect. His behavior is normal.  Nursing note and vitals reviewed.  Vitals:   12/14/17 1232  BP: 98/60  Pulse: 71  Resp: 16  Temp: 98 F (36.7 C)  TempSrc: Oral  SpO2: 97%  Weight: 170 lb 6.4 oz (77.3 kg)  Height:  (1.803 m)      Assessment & Plan:  Cody Jennings is a 52 y.o. male Erectile dysfunction, unspecified erectile dysfunction type - Plan: Testosterone, Free, Total, SHBG  - plan to  return for am lab only visit for testosterone testing.  discusssed need for repeat if low. Ok to continue cialis if needed for now.   Need for diphtheria-tetanus-pertussis (Tdap) vaccine - Plan: Tdap vaccine greater than or equal to 7yo IM given.   Hyperlipidemia, unspecified hyperlipidemia type - Plan: Comprehensive metabolic panel, Lipid panel  - recheck labs, but unlikely need for statin based on prior ASCVD risk reading.   No orders of the defined types were placed in this  encounter.  Patient Instructions   Return between 8 and 10 in the morning for lab only visit to check testosterone and fasting bloodwork as discussed.   Cialis if needed.   Thanks for coming in today. Let me know if there are other questions.    IF you received an x-ray today, you will receive an invoice from [facility]. Please contact [facility] at [phone] with questions or concerns regarding your invoice.   IF you received labwork today, you will receive an invoice from [place]. Please contact [facility] at [phone] with questions or concerns regarding your invoice.   Our billing staff will not be able to assist you with questions regarding bills from these companies.  You will be contacted with the lab results as soon as they are available. The fastest way to get your results is to activate your My Chart account. Instructions are located on the last page of this paperwork. If you have not heard from [place] regarding the results in 2 weeks, please contact this office.       I personally performed the services described in this documentation, which was scribed in my presence. The recorded information has been reviewed and considered for accuracy and completeness, addended by me as needed, and agree with information above.  Signed,   Meredith Staggers, MD Primary Care at [facility].  12/14/17 10:33 PM

## 2017-12-15 ENCOUNTER — Ambulatory Visit (INDEPENDENT_AMBULATORY_CARE_PROVIDER_SITE_OTHER): Payer: BLUE CROSS/BLUE SHIELD | Admitting: Family Medicine

## 2017-12-15 DIAGNOSIS — E785 Hyperlipidemia, unspecified: Secondary | ICD-10-CM

## 2017-12-15 DIAGNOSIS — N529 Male erectile dysfunction, unspecified: Secondary | ICD-10-CM

## 2017-12-15 NOTE — Progress Notes (Signed)
Pt came for a lab only visit. Pt did not see an provider

## 2017-12-21 LAB — COMPREHENSIVE METABOLIC PANEL
ALT: 34 IU/L (ref 0–44)
AST: 28 IU/L (ref 0–40)
Albumin/Globulin Ratio: 2.2 (ref 1.2–2.2)
Albumin: 4.9 g/dL (ref 3.5–5.5)
Alkaline Phosphatase: 60 IU/L (ref 39–117)
BILIRUBIN TOTAL: 0.4 mg/dL (ref 0.0–1.2)
BUN/Creatinine Ratio: 16 (ref 9–20)
BUN: 18 mg/dL (ref 6–24)
CHLORIDE: 107 mmol/L — AB (ref 96–106)
CO2: 21 mmol/L (ref 20–29)
Calcium: 9.8 mg/dL (ref 8.7–10.2)
Creatinine, Ser: 1.14 mg/dL (ref 0.76–1.27)
GFR calc non Af Amer: 74 mL/min/{1.73_m2} (ref >59)
GFR, EST AFRICAN AMERICAN: 86 mL/min/{1.73_m2} (ref >59)
GLUCOSE: 97 mg/dL (ref 65–99)
Globulin, Total: 2.2 g/dL (ref 1.5–4.5)
Potassium: 4.7 mmol/L (ref 3.5–5.2)
Sodium: 144 mmol/L (ref 134–144)
TOTAL PROTEIN: 7.1 g/dL (ref 6.0–8.5)

## 2017-12-21 LAB — LIPID PANEL
CHOL/HDL RATIO: 4 ratio (ref 0.0–5.0)
Cholesterol, Total: 259 mg/dL — ABNORMAL HIGH (ref 100–199)
HDL: 64 mg/dL (ref >39)
LDL Calculated: 173 mg/dL — ABNORMAL HIGH (ref 0–99)
Triglycerides: 108 mg/dL (ref 0–149)
VLDL Cholesterol Cal: 22 mg/dL (ref 5–40)

## 2017-12-21 LAB — TESTOSTERONE, FREE, TOTAL, SHBG
SEX HORMONE BINDING: 98.7 nmol/L — AB (ref 19.3–76.4)
Testosterone, Free: 13 pg/mL (ref 7.2–24.0)
Testosterone: 1102 ng/dL — ABNORMAL HIGH (ref 264–916)

## 2018-01-01 ENCOUNTER — Encounter: Payer: Self-pay | Admitting: Family Medicine

## 2018-01-11 ENCOUNTER — Encounter: Payer: Self-pay | Admitting: Family Medicine

## 2018-08-18 ENCOUNTER — Ambulatory Visit: Payer: Self-pay | Admitting: Emergency Medicine

## 2018-08-18 ENCOUNTER — Telehealth: Payer: Self-pay | Admitting: Family Medicine

## 2018-08-18 DIAGNOSIS — Z23 Encounter for immunization: Secondary | ICD-10-CM

## 2018-08-18 DIAGNOSIS — G4709 Other insomnia: Secondary | ICD-10-CM

## 2018-08-18 DIAGNOSIS — Z7184 Encounter for health counseling related to travel: Secondary | ICD-10-CM

## 2018-08-18 NOTE — Telephone Encounter (Signed)
Pt came in and wanted to know if he could get his eszopiclone (LUNESTA) 1 MG TABS tablet [[id number] filled pt states that he travels and that he needs it for sleep.

## 2018-08-25 NOTE — Telephone Encounter (Signed)
Please advise 

## 2018-08-26 MED ORDER — ESZOPICLONE 1 MG PO TABS: 1.0000 mg | ORAL_TABLET | Freq: Every evening | ORAL | 0 refills | Status: DC | PRN

## 2018-08-26 NOTE — Telephone Encounter (Signed)
Discussed April 2019.  Controlled substance database (PDMP) reviewed. No concerns appreciated.  Refilled.

## 2018-08-30 ENCOUNTER — Telehealth: Payer: Self-pay | Admitting: Family Medicine

## 2018-08-30 DIAGNOSIS — G4709 Other insomnia: Secondary | ICD-10-CM

## 2018-08-30 DIAGNOSIS — Z7184 Encounter for health counseling related to travel: Secondary | ICD-10-CM

## 2018-08-30 NOTE — Telephone Encounter (Signed)
Requested medication (s) are due for refill today: No - too early  Requested medication (s) are on the active medication list: Yes  Last refill:  08/26/18  Future visit scheduled: No  Notes to clinic:  Pt. Is currently out of town - [place].    Requested Prescriptions  Pending Prescriptions Disp Refills   eszopiclone (LUNESTA) 1 MG TABS tablet 25 tablet 0    Sig: Take 1 tablet (1 mg total) by mouth at bedtime as needed for sleep. Take 1-2 tablets immediately before bedtime     Not Delegated - Psychiatry:  Anxiolytics/Hypnotics Failed - 08/30/2018 11:08 AM      Failed - This refill cannot be delegated      Failed - Urine Drug Screen completed in last 360 days.      Failed - Valid encounter within last 6 months    Recent Outpatient Visits          8 months ago Hyperlipidemia, unspecified hyperlipidemia type   Primary Care at [facility], Asencion Partridge, MD   8 months ago Erectile dysfunction, unspecified erectile dysfunction type   [facility] Care at [facility], Asencion Partridge, MD   9 months ago Counseling about travel   Primary Care at [facility], Asencion Partridge, MD   1 year ago Annual physical exam   Primary Care at [facility], Asencion Partridge, MD   3 years ago Abdominal wall pain   [facility] at [facility], Dema Severin, PA-C

## 2018-08-30 NOTE — Telephone Encounter (Signed)
Copied from CRM [id number]. Topic: Quick Communication - Rx Refill/Question >> Aug 30, 2018 10:35 AM Gerrianne Scale wrote: Medication: eszopiclone (LUNESTA) 1 MG TABS tablet  Has the patient contacted their pharmacy? No.pt is in [place] (Agent: If no, request that the patient contact the pharmacy for the refill.) (Agent: If yes, when and what did the pharmacy advise?)  Preferred Pharmacy (with phone number or street name): [facility] [phone] [place] [address] (P) [phone]  Agent: Please be advised that RX refills may take up to 3 business days. We ask that you follow-up with your pharmacy.

## 2018-09-02 NOTE — Telephone Encounter (Signed)
I ordered this feb 1st.  Did he not get that rx?

## 2018-09-02 NOTE — Telephone Encounter (Signed)
Please advise 

## 2018-09-04 NOTE — Telephone Encounter (Signed)
Please advise, is it ok to resent Rx to [facility] pharmacy?

## 2018-09-04 NOTE — Telephone Encounter (Signed)
Pt called back.  States that [facility] can not transfer this medication because it has never been filled before under this RX number- for a first time medication it has to be called in to the pharmacy. Pt wants this called in to the [facility] in [place] to  [id number] - [place], [place] - [address] AT [facility] [address] [phone] (Phone) [phone] (Fax)   Pt can be reached at [phone]

## 2018-09-04 NOTE — Telephone Encounter (Signed)
Pt calling stating that he has been in [place] he did not pick RX up in [place] because he was out of town he states that the [facility] in [place] advised him to call provider to get medicine transferred from [facility] in [place] to [place] i was told by assistant that pt can call pharmacy here to get them to transfer medicine to [place] and if any problems to give [place] a call back  Please call pt at [phone]

## 2018-09-05 MED ORDER — ESZOPICLONE 1 MG PO TABS: 1.0000 mg | ORAL_TABLET | Freq: Every evening | ORAL | 0 refills | Status: DC | PRN

## 2018-09-05 NOTE — Telephone Encounter (Signed)
Done

## 2019-01-03 ENCOUNTER — Telehealth: Payer: Self-pay | Admitting: Family Medicine

## 2019-01-03 NOTE — Telephone Encounter (Signed)
Called pt to schedule annual physical. [email or web address]

## 2019-01-10 ENCOUNTER — Ambulatory Visit (INDEPENDENT_AMBULATORY_CARE_PROVIDER_SITE_OTHER): Payer: BLUE CROSS/BLUE SHIELD | Admitting: Family Medicine

## 2019-01-10 ENCOUNTER — Other Ambulatory Visit: Payer: Self-pay

## 2019-01-10 ENCOUNTER — Telehealth: Payer: Self-pay | Admitting: Family Medicine

## 2019-01-10 ENCOUNTER — Encounter: Payer: Self-pay | Admitting: Family Medicine

## 2019-01-10 VITALS — BP 106/68 | HR 62 | Temp 97.9°F | Resp 18 | Ht 72.13 in | Wt 178.4 lb

## 2019-01-10 DIAGNOSIS — Z720 Tobacco use: Secondary | ICD-10-CM

## 2019-01-10 DIAGNOSIS — G4709 Other insomnia: Secondary | ICD-10-CM

## 2019-01-10 DIAGNOSIS — Z7184 Encounter for health counseling related to travel: Secondary | ICD-10-CM

## 2019-01-10 DIAGNOSIS — N529 Male erectile dysfunction, unspecified: Secondary | ICD-10-CM | POA: Insufficient documentation

## 2019-01-10 DIAGNOSIS — E7849 Other hyperlipidemia: Secondary | ICD-10-CM

## 2019-01-10 MED ORDER — TADALAFIL 20 MG PO TABS: 10.0000 mg | ORAL_TABLET | ORAL | 5 refills | Status: DC | PRN

## 2019-01-10 MED ORDER — ESZOPICLONE 1 MG PO TABS: 1.0000 mg | ORAL_TABLET | Freq: Every evening | ORAL | 0 refills | Status: AC | PRN

## 2019-01-10 NOTE — Telephone Encounter (Signed)
Med sent.

## 2019-01-10 NOTE — Telephone Encounter (Signed)
Patient was just seen [facility] today 6/17. Patient states DR only sent in one of the medications to pharmacy, Pharmacy did not have prescription tadalafil (CIALIS) 20 MG tablet  Patient would like a call back when this is taken care of.  Patient expressed he is going out of town and needs this done asap.  Patient call back [phone] [id number] [place], [place]. AT [address] [phone] (Phone) [phone] (Fax)

## 2019-01-10 NOTE — Telephone Encounter (Signed)
Pt is calling back, the cialis has still not been called into the pharmacy Please call back [phone]

## 2019-01-10 NOTE — Patient Instructions (Addendum)
     If you have lab work done today you will be contacted with your lab results within the next 2 weeks.  If you have not heard from [place] then please contact us. The fastest way to get your results is to register for My Chart. Call if concerns about medication-risk/benefit discussed Pt with elevated cholesterol-will call if medication desired for treatment-pt will continue to exercise and follow a health diet with low cholesterol options Have oral exam with dentist due to use of oral tobacco  IF you received an x-ray today, you will receive an invoice from [facility]. Please contact [facility] at [phone] with questions or concerns regarding your invoice.   IF you received labwork today, you will receive an invoice from [facility]. Please contact [facility] at [phone] with questions or concerns regarding your invoice.   Our billing staff will not be able to assist you with questions regarding bills from these companies.  You will be contacted with the lab results as soon as they are available. The fastest way to get your results is to activate your My Chart account. Instructions are located on the last page of this paperwork. If you have not heard from [place] regarding the results in 2 weeks, please contact this office.

## 2019-01-10 NOTE — Progress Notes (Signed)
Acute Office Visit  Subjective:    Patient ID: Cody Jennings, male    DOB: November 08, 1965, 53 y.o.   MRN: [id number]  Chief Complaint  Patient presents with  . Medication Refill    Lunesta and Cialis    HPI Patient is in today for refill on medication Lunesta-pt takes as needed while traveling. Pt does not take medication nightly. Pt states he uses other sleep architecture methods. Pt does not take medication at home. Pt understands controlled substance and the medication can only be prescribed by this office. Pt understands risk of medication and black box warning was read to pt   Cialis-pt uses prn for ED. Pt states he does not use medication nightly. Pt has not had any concerns while taking medication with CP.  Past Medical History:  Diagnosis Date  . Allergy     Past Surgical History:  Procedure Laterality Date  . KNEE SURGERY     Left  . TONSILLECTOMY      Family History  Problem Relation Age of Onset  . Diabetes Mother   . Hyperlipidemia Other   . Lung cancer Father   . Kidney cancer Father   . Colon cancer Neg Hx    Father with lung CA-smoker Social History   Socioeconomic History  . Marital status: Single    Spouse name: Not on file  . Number of children: 0  . Years of education: Not on file  . Highest education level: Not on file  Occupational History  . Occupation: Press photographer  Social Needs  . Financial resource strain: Not on file  . Food insecurity    Worry: Not on file    Inability: Not on file  . Transportation needs    Medical: Not on file    Non-medical: Not on file  Tobacco Use  . Smoking status: Never Smoker  . Smokeless tobacco: Never Used  Substance and Sexual Activity  . Alcohol use: No  . Drug use: No  . Sexual activity: Yes  Lifestyle  . Physical activity    Days per week: Not on file    Minutes per session: Not on file  . Stress: Not on file  Relationships  . Social Herbalist on phone: Not on file    Gets together: Not  on file    Attends religious service: Not on file    Active member of club or organization: Not on file    Attends meetings of clubs or organizations: Not on file    Relationship status: Not on file  . Intimate partner violence    Fear of current or ex partner: Not on file    Emotionally abused: Not on file    Physically abused: Not on file    Forced sexual activity: Not on file  Other Topics Concern  . Not on file  Social History Narrative   Single no children employed in Materials [employer]. 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  pt chews tobacco  Outpatient Medications Prior to Visit  Medication Sig Dispense Refill  . eszopiclone (LUNESTA) 1 MG TABS tablet Take 1 tablet (1 mg total) by mouth at bedtime as needed for sleep. Take 1-2 tablets immediately before bedtime (Patient not taking: Reported on 01/10/2019) 25 tablet 0  . finasteride (PROPECIA) 1 MG tablet Take 1 mg by mouth daily.      . tadalafil (CIALIS) 20 MG tablet Take  0.5-1 tablets (10-20 mg total) by mouth every other day as needed for erectile dysfunction. (Patient not taking: Reported on 01/10/2019) 10 tablet 3   No facility-administered medications prior to visit.     No Known Allergies  Review of Systems  Constitutional: Negative for chills, fever and weight loss.  HENT: Negative for congestion and sinus pain.   Respiratory: Negative for cough.   Cardiovascular: Negative for chest pain.  Gastrointestinal: Negative for abdominal pain.  Genitourinary: Negative for dysuria.  Musculoskeletal: Negative for myalgias.  Neurological: Negative for headaches.  Psychiatric/Behavioral: The patient has insomnia.   ED     Objective:    Physical Exam  Constitutional: He is oriented to person, place, and time. He appears well-developed. No distress.  HENT:  Head: Normocephalic and atraumatic.  Right Ear: External ear normal.  Eyes:  Conjunctivae are normal.  Neck: Normal range of motion. Neck supple.  Cardiovascular: Normal rate and regular rhythm.  Pulmonary/Chest: Breath sounds normal.  Abdominal: Soft. Bowel sounds are normal.  Neurological: He is alert and oriented to person, place, and time.  Psychiatric: He has a normal mood and affect. His behavior is normal.    There were no vitals taken for this visit. Wt Readings from Last 3 Encounters:  12/14/17 170 lb 6.4 oz (77.3 kg)  11/10/17 173 lb 3.2 oz (78.6 kg)  01/22/17 166 lb 6.4 oz (75.5 kg)    No results found for: TSH Lab Results  Component Value Date   WBC 5.0 05/16/2015   HGB 14.9 05/16/2015   HCT 42.6 (A) 05/16/2015   MCV 87.8 05/16/2015   Lab Results  Component Value Date   NA 144 12/15/2017   K 4.7 12/15/2017   CO2 21 12/15/2017   GLUCOSE 97 12/15/2017   BUN 18 12/15/2017   CREATININE 1.14 12/15/2017   BILITOT 0.4 12/15/2017   ALKPHOS 60 12/15/2017   AST 28 12/15/2017   ALT 34 12/15/2017   PROT 7.1 12/15/2017   ALBUMIN 4.9 12/15/2017   CALCIUM 9.8 12/15/2017   Lab Results  Component Value Date   CHOL 259 (H) 12/15/2017   Lab Results  Component Value Date   HDL 64 12/15/2017   Lab Results  Component Value Date   LDLCALC 173 (H) 12/15/2017   Lab Results  Component Value Date   TRIG 108 12/15/2017   Lab Results  Component Value Date   CHOLHDL 4.0 12/15/2017   No results found for: HGBA1C     Assessment & Plan:   Problem List Items Addressed This Visit    None     1. Counseling about travel Pt uses medication for travel-risk/benefit/side effects d/w pt - eszopiclone (LUNESTA) 1 MG TABS tablet; Take 1 tablet (1 mg total) by mouth at bedtime as needed for sleep. Take 1-2 tablets immediately before bedtime  Dispense: 30 tablet; Refill: 0  2. Other insomnia Sleep architecture discussed - eszopiclone (LUNESTA) 1 MG TABS tablet; Take 1 tablet (1 mg total) by mouth at bedtime as needed for sleep. Take 1-2 tablets  immediately before bedtime  Dispense: 30 tablet; Refill: 0  3. Erectile dysfunction, unspecified erectile dysfunction type Risk/benefit discussed-offered PSA testing-pt does not have FH-declines - tadalafil (CIALIS) 20 MG tablet; Take 0.5-1 tablets (10-20 mg total) by mouth every other day as needed for erectile dysfunction.  Dispense: 30 tablet; Refill:   4. Tobacco chew use D/w pt risk of oral cancer-will schedule appt with dentist for oral exam  5. Other hyperlipidemia Pt declines medication-+FH ,  diet -primary plant based-cardiovascular risk discussed  Toriano Aikey Mat CarneLEIGH Lyna Laningham, MD

## 2019-01-10 NOTE — Telephone Encounter (Signed)
01/10/2019 - PATIENT SAW DR. Holly Bodily TODAY (01/10/2019) IN THE OFFICE. I LEFT HIM A VOICE MAIL TO CALL AND SCHEDULE A 6 MONTH FOLLOW-UP WITH DR. Carlota Raspberry AND THAT HIS CHARGES TODAY WITH THE DISCOUNT WAS $117.00. [place]

## 2019-01-11 ENCOUNTER — Other Ambulatory Visit: Payer: Self-pay

## 2019-01-11 DIAGNOSIS — N529 Male erectile dysfunction, unspecified: Secondary | ICD-10-CM

## 2019-01-11 MED ORDER — TADALAFIL 20 MG PO TABS: 10.0000 mg | ORAL_TABLET | ORAL | 5 refills | Status: AC | PRN

## 2019-01-11 NOTE — Telephone Encounter (Signed)
Cialis Rx were being printed, not going through electronically to pharmacy.  Resent electronically, receipt confirmed.  Patient notified.  No further questions at this time.
